# Patient Record
Sex: Male | Born: 2000 | Race: Black or African American | Hispanic: No | Marital: Single | Smoking: Never smoker
Health system: Southern US, Community
[De-identification: ages and names within clinical notes are randomized; demographics above are authoritative.]

## PROBLEM LIST (undated history)

## (undated) HISTORY — PX: OTHER SURGICAL HISTORY: SHX169

---
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
## 2020-02-29 ENCOUNTER — Encounter (HOSPITAL_BASED_OUTPATIENT_CLINIC_OR_DEPARTMENT_OTHER): Payer: Self-pay | Admitting: *Deleted

## 2020-02-29 ENCOUNTER — Emergency Department (HOSPITAL_BASED_OUTPATIENT_CLINIC_OR_DEPARTMENT_OTHER)
Admission: EM | Admit: 2020-02-29 | Discharge: 2020-02-29 | Disposition: A | Discharge status: Home / Self Care | Payer: BC Managed Care – PPO | Attending: Emergency Medicine | Admitting: Emergency Medicine

## 2020-02-29 ENCOUNTER — Other Ambulatory Visit: Payer: Self-pay

## 2020-02-29 ENCOUNTER — Emergency Department (HOSPITAL_BASED_OUTPATIENT_CLINIC_OR_DEPARTMENT_OTHER): Payer: BC Managed Care – PPO

## 2020-02-29 DIAGNOSIS — Y9367 Activity, basketball: Secondary | ICD-10-CM | POA: Diagnosis not present

## 2020-02-29 DIAGNOSIS — S52615A Nondisplaced fracture of left ulna styloid process, initial encounter for closed fracture: Secondary | ICD-10-CM | POA: Diagnosis not present

## 2020-02-29 DIAGNOSIS — W2105XA Struck by basketball, initial encounter: Secondary | ICD-10-CM | POA: Insufficient documentation

## 2020-02-29 DIAGNOSIS — S6992XA Unspecified injury of left wrist, hand and finger(s), initial encounter: Secondary | ICD-10-CM | POA: Diagnosis present

## 2020-02-29 MED ORDER — ACETAMINOPHEN 325 MG PO TABS: 650.0000 mg | ORAL_TABLET | Freq: Four times a day (QID) | ORAL | 0 refills | Status: DC | PRN

## 2020-02-29 MED ORDER — IBUPROFEN 600 MG PO TABS: 600.0000 mg | ORAL_TABLET | Freq: Four times a day (QID) | ORAL | 0 refills | Status: DC | PRN

## 2020-02-29 NOTE — ED Provider Notes (Signed)
[facility] EMERGENCY DEPARTMENT Provider Note   CSN: [id number] Arrival date & time: 02/29/20  1829     History Chief Complaint  Patient presents with   Wrist Injury    Terry Salinas is a 19 y.o. male presenting to the ED with left wrist pain.  Patient was playing basketball and jumping for a shot, then had his legs knocked out under him.  He fell on his outstretched left hand, and now has pain in his left wrist.  No other acute injuries reported.  He hasn't taken any meds.  Pain is 10/10, localized to the wrist, does not travel, is worse with arm movement, better at rest. He's never had this pain before.  It is throbbing in quality.  NKDA  HPI     History reviewed. No pertinent past medical history.  Patient Active Problem List   Diagnosis Date Noted   Right knee injury, subsequent encounter 01/24/2017   Fracture of phalanx of left middle finger 08/13/2015    No past surgical history on file.     No family history on file.  Social History   Tobacco Use   Smoking status: Never Smoker   Smokeless tobacco: Never Used  Vaping Use   Vaping Use: Never used  Substance Use Topics   Alcohol use: No    Alcohol/week: 0.0 standard drinks   Drug use: No    Home Medications Prior to Admission medications   Medication Sig Start Date End Date Taking? Authorizing Provider  acetaminophen (TYLENOL) 325 MG tablet Take 2 tablets (650 mg total) by mouth every 6 (six) hours as needed for up to 30 doses for mild pain. 02/29/20   Terald Sleeper, MD  ibuprofen (ADVIL) 600 MG tablet Take 1 tablet (600 mg total) by mouth every 6 (six) hours as needed for up to 30 doses for mild pain or moderate pain. 02/29/20   Terald Sleeper, MD  ibuprofen (ADVIL,MOTRIN) 600 MG tablet Take 1 tablet (600 mg total) by mouth every 6 (six) hours as needed. 04/14/18   Derwood Kaplan, MD    Allergies    Patient has no known allergies.  Review of Systems   Review of Systems    Constitutional: Negative for chills and fever.  Eyes: Negative for photophobia and visual disturbance.  Respiratory: Negative for cough and shortness of breath.   Cardiovascular: Negative for chest pain and palpitations.  Gastrointestinal: Negative for abdominal pain and vomiting.  Musculoskeletal: Positive for arthralgias and myalgias.  Skin: Negative for rash and wound.  Neurological: Negative for weakness and numbness.  Psychiatric/Behavioral: Negative for agitation and confusion.  All other systems reviewed and are negative.   Physical Exam Updated Vital Signs BP (!) 132/91 (BP Location: Right Arm)    Pulse 67    Temp 98.7 F (37.1 C) (Oral)    Resp 20    Ht 6' (1.829 m)    Wt 102.1 kg    SpO2 100%    BMI 30.52 kg/m   Physical Exam Vitals and nursing note reviewed.  Constitutional:      Appearance: He is well-developed.  HENT:     Head: Normocephalic and atraumatic.  Eyes:     Conjunctiva/sclera: Conjunctivae normal.  Cardiovascular:     Rate and Rhythm: Normal rate and regular rhythm.     Pulses: Normal pulses.  Pulmonary:     Effort: Pulmonary effort is normal. No respiratory distress.     Breath sounds: Normal breath sounds.  Musculoskeletal:  Cervical back: Neck supple.     Comments: Tenderness of the distal radius and ulna, no gross deformity, visible swelling of the ulnar aspect of the wrist. No ttp of the palm or fingers or proximal forearm No ttp of the elbow or shoulder Remainder of MSK exam normal  Skin:    General: Skin is warm and dry.  Neurological:     General: No focal deficit present.     Mental Status: He is alert and oriented to person, place, and time.     Sensory: No sensory deficit.     Motor: No weakness.     Comments: Intact opposition, finger abduction.  Sensation preserved in all hand dermatomes of left hand.  No wrist drop.  Wrist flexion/extension limited by pain.   Psychiatric:        Mood and Affect: Mood normal.        Behavior:  Behavior normal.     ED Results / Procedures / Treatments   Labs (all labs ordered are listed, but only abnormal results are displayed) Labs Reviewed - No data to display  EKG None  Radiology DG Wrist Complete Left  Result Date: 02/29/2020 CLINICAL DATA:  19 year old male status post basketball injury. EXAM: LEFT WRIST - COMPLETE 3+ VIEW COMPARISON:  Left middle finger series 08/09/2015. FINDINGS: Minimally displaced ulnar styloid fracture. Skeletally immature. Distal radius appears within normal limits. Normal carpal bone alignment and joint spaces. Scaphoid intact. No other acute osseous abnormality identified. IMPRESSION: Minimally displaced ulnar styloid fracture. Electronically Signed   By: Odessa Fleming M.D.   On: 02/29/2020 19:45    Procedures Procedures (including critical care time)  Medications Ordered in ED Medications - No data to display  ED Course  I have reviewed the triage vital signs and the nursing notes.  Pertinent labs & imaging results that were available during my care of the patient were reviewed by me and considered in my medical decision making (see chart for details).  19 yo male presenting to ED with ulnar styloid fracture after FOOSH injury playing basketball.  This fx is minimally displaced on xray.  He is neurovascularly intact on exam.  Plan for an ulnar gutter splint, f/u with hand surgery.  Motrin and tylenol for pain at home.  Advised not to use his left hand until cleared to do so by his surgeon.  Work note provided.  Clinical Course as of Mar 01 1113  Wynelle Link Feb 29, 2020  2045 Placed ulnar gutter splint, d/c with hand surgery f/u   [MT]    Clinical Course User Index [MT] Terald Sleeper, MD    Final Clinical Impression(s) / ED Diagnoses Final diagnoses:  Closed nondisplaced fracture of styloid process of left ulna, initial encounter    Rx / DC Orders ED Discharge Orders         Ordered    ibuprofen (ADVIL) 600 MG tablet  Every 6 hours PRN         02/29/20 2047    acetaminophen (TYLENOL) 325 MG tablet  Every 6 hours PRN        02/29/20 2047           Terald Sleeper, MD 03/01/20 1114

## 2020-02-29 NOTE — ED Triage Notes (Signed)
States he was playing basketball and feet got taken out from under him and landed on left wrist. +deformity. CMS intact

## 2020-09-01 ENCOUNTER — Other Ambulatory Visit: Payer: Self-pay

## 2020-09-01 ENCOUNTER — Emergency Department (HOSPITAL_BASED_OUTPATIENT_CLINIC_OR_DEPARTMENT_OTHER)
Admission: EM | Admit: 2020-09-01 | Discharge: 2020-09-01 | Disposition: A | Discharge status: Home / Self Care | Payer: BC Managed Care – PPO | Attending: Emergency Medicine | Admitting: Emergency Medicine

## 2020-09-01 ENCOUNTER — Emergency Department (HOSPITAL_BASED_OUTPATIENT_CLINIC_OR_DEPARTMENT_OTHER): Payer: BC Managed Care – PPO

## 2020-09-01 ENCOUNTER — Encounter (HOSPITAL_BASED_OUTPATIENT_CLINIC_OR_DEPARTMENT_OTHER): Payer: Self-pay

## 2020-09-01 DIAGNOSIS — S92355A Nondisplaced fracture of fifth metatarsal bone, left foot, initial encounter for closed fracture: Secondary | ICD-10-CM | POA: Diagnosis not present

## 2020-09-01 DIAGNOSIS — Y9367 Activity, basketball: Secondary | ICD-10-CM | POA: Diagnosis not present

## 2020-09-01 DIAGNOSIS — S99922A Unspecified injury of left foot, initial encounter: Secondary | ICD-10-CM | POA: Diagnosis present

## 2020-09-01 DIAGNOSIS — X58XXXA Exposure to other specified factors, initial encounter: Secondary | ICD-10-CM | POA: Insufficient documentation

## 2020-09-01 NOTE — ED Triage Notes (Signed)
Pt reports injury to left foot playing basketball ~945p-NAD-limping gait

## 2020-09-01 NOTE — ED Provider Notes (Addendum)
[facility] EMERGENCY DEPARTMENT Provider Note   CSN: [id number] Arrival date & time: 09/01/20  2213     History Chief Complaint  Patient presents with  . Foot Injury    Terry Salinas is a 20 y.o. male.  20 year old male presents with complaint of left lateral foot pain.  Patient states that he was playing basketball earlier today, came down on his right foot and when he went to pivot to the left he felt a pop on the lateral aspect of his left foot.  Patient states he has broken his right foot in the past and feels like his left foot gives out frequently but never to this extent.  Reports significant difficulty bearing weight on the foot after the injury.  No other complaints or concerns today.        History reviewed. No pertinent past medical history.  Patient Active Problem List   Diagnosis Date Noted  . Right knee injury, subsequent encounter 01/24/2017  . Fracture of phalanx of left middle finger 08/13/2015    History reviewed. No pertinent surgical history.     No family history on file.  Social History   Tobacco Use  . Smoking status: Never Smoker  . Smokeless tobacco: Never Used  Vaping Use  . Vaping Use: Never used  Substance Use Topics  . Alcohol use: No  . Drug use: No    Home Medications Prior to Admission medications   Medication Sig Start Date End Date Taking? Authorizing Provider  acetaminophen (TYLENOL) 325 MG tablet Take 2 tablets (650 mg total) by mouth every 6 (six) hours as needed for up to 30 doses for mild pain. 02/29/20   Terald Sleeper, MD  ibuprofen (ADVIL) 600 MG tablet Take 1 tablet (600 mg total) by mouth every 6 (six) hours as needed for up to 30 doses for mild pain or moderate pain. 02/29/20   Terald Sleeper, MD  ibuprofen (ADVIL,MOTRIN) 600 MG tablet Take 1 tablet (600 mg total) by mouth every 6 (six) hours as needed. 04/14/18   Derwood Kaplan, MD    Allergies    Fish allergy  Review of Systems   Review of  Systems  Constitutional: Negative for fever.  Musculoskeletal: Positive for arthralgias, gait problem and myalgias.  Skin: Negative for color change, rash and wound.  Allergic/Immunologic: Negative for immunocompromised state.  Neurological: Negative for weakness and numbness.    Physical Exam Updated Vital Signs BP (!) 124/91   Pulse 60   Temp 97.6 F (36.4 C) (Oral)   Resp 16   Ht 6' (1.829 m)   Wt 94.3 kg   SpO2 100%   BMI 28.21 kg/m   Physical Exam Vitals and nursing note reviewed.  Constitutional:      General: He is not in acute distress.    Appearance: He is well-developed. He is not diaphoretic.  HENT:     Head: Normocephalic and atraumatic.  Cardiovascular:     Pulses: Normal pulses.  Pulmonary:     Effort: Pulmonary effort is normal.  Musculoskeletal:        General: Swelling and tenderness present. No deformity.       Legs:     Comments: Tenderness with palpation of the lateral left foot, no specific tenderness at the proximal fifth metatarsal.  DP pulse present, sensation intact.  Skin:    General: Skin is warm and dry.     Findings: No bruising, erythema or rash.  Neurological:  Mental Status: He is alert and oriented to person, place, and time.     Sensory: No sensory deficit.     Motor: No weakness.  Psychiatric:        Behavior: Behavior normal.     ED Results / Procedures / Treatments   Labs (all labs ordered are listed, but only abnormal results are displayed) Labs Reviewed - No data to display  EKG None  Radiology DG Foot Complete Left  Result Date: 09/01/2020 CLINICAL DATA:  Basketball injury with lateral foot pain, initial encounter EXAM: LEFT FOOT - COMPLETE 3+ VIEW COMPARISON:  None. FINDINGS: There is a fracture noted in the proximal fifth metatarsal without significant displacement. Mild soft tissue swelling is noted. No other fractures are seen. IMPRESSION: Proximal fifth metatarsal fracture involving the diaphyseal metaphyseal  junction. Electronically Signed   By: Alcide Clever M.D.   On: 09/01/2020 22:52    Procedures Procedures   Medications Ordered in ED Medications - No data to display  ED Course  I have reviewed the triage vital signs and the nursing notes.  Pertinent labs & imaging results that were available during my care of the patient were reviewed by me and considered in my medical decision making (see chart for details).  Clinical Course as of 09/01/20 2310  Wed Sep 01, 2020  [id number] 20 year old male with lateral left foot pain, felt pop playing basketball today. On exam, swelling to lateral foot with general lateral tenderness without point tenderness.  [LM]  2310 XR with 5th metatarsal fracture. Patient was placed in a  posterior splint and given crutches, advised to be nonweightbearing and follow-up with sports medicine. [LM]    Clinical Course User Index [LM] Alden Hipp   MDM Rules/Calculators/A&P                         SPLINT APPLICATION Date/Time: 11:33 PM Authorized by: Jeannie Fend Consent: Verbal consent obtained. Risks and benefits: risks, benefits and alternatives were discussed Consent given by: patient Splint applied by: orthopedic technician Location details: left leg Splint type: posterior OCL Supplies used: webril, ACE, stockinet, OCL Post-procedure: The splinted body part was neurovascularly unchanged following the procedure. Patient tolerance: Patient tolerated the procedure well with no immediate complications.   Final Clinical Impression(s) / ED Diagnoses Final diagnoses:  Closed nondisplaced fracture of fifth metatarsal bone of left foot, initial encounter    Rx / DC Orders ED Discharge Orders    None       Jeannie Fend, PA-C 09/01/20 2310    Jeannie Fend, PA-C 09/01/20 2333    Tilden Fossa, MD 09/02/20 2119

## 2020-09-01 NOTE — Discharge Instructions (Signed)
Nonweight bearing. Follow up with sports medicine, referral given. Take Motrin and Tylenol as needed as directed for pain. You can apply ice to the splint and elevate for 20 minutes at a time to help with pain/swelling.

## 2020-09-06 ENCOUNTER — Other Ambulatory Visit: Payer: Self-pay

## 2020-09-06 ENCOUNTER — Encounter: Payer: Self-pay | Admitting: Family Medicine

## 2020-09-06 ENCOUNTER — Ambulatory Visit: Payer: BC Managed Care – PPO | Admitting: Family Medicine

## 2020-09-06 VITALS — BP 126/90 | Ht 72.0 in | Wt 208.0 lb

## 2020-09-06 DIAGNOSIS — S92355D Nondisplaced fracture of fifth metatarsal bone, left foot, subsequent encounter for fracture with routine healing: Secondary | ICD-10-CM | POA: Insufficient documentation

## 2020-09-06 DIAGNOSIS — S92355A Nondisplaced fracture of fifth metatarsal bone, left foot, initial encounter for closed fracture: Secondary | ICD-10-CM

## 2020-09-06 NOTE — Assessment & Plan Note (Addendum)
Injury occurred on 4/20.  It appears more in the shaft of the fifth metatarsal of as opposed to a Jones fracture. -Counseled on home exercise therapy and supportive care. -Cam walker. -Provided work note. -Follow-up in 2 weeks and reimage.

## 2020-09-06 NOTE — Progress Notes (Addendum)
  Terry Salinas - 20 y.o. male MRN [id number]  Date of birth: 12-31-2000  SUBJECTIVE:  Including CC & ROS.  No chief complaint on file.   Terry Salinas is a 20 y.o. male that is presenting with left foot pain.  He was playing basketball felt a pop in his left foot.  X-ray of the foot was demonstrating a fracture.  He was placed in a splint.  Still having ongoing swelling.  Independent review of the left foot x-ray from 4/20 shows a nondisplaced shaft fracture   Review of Systems See HPI   HISTORY: Past Medical, Surgical, Social, and Family History Reviewed & Updated per EMR.   Pertinent Historical Findings include:  No past medical history on file.  No past surgical history on file.  No family history on file.  Social History   Socioeconomic History  . Marital status: Single    Spouse name: Not on file  . Number of children: Not on file  . Years of education: Not on file  . Highest education level: Not on file  Occupational History  . Not on file  Tobacco Use  . Smoking status: Never Smoker  . Smokeless tobacco: Never Used  Vaping Use  . Vaping Use: Never used  Substance and Sexual Activity  . Alcohol use: No  . Drug use: No  . Sexual activity: Not on file  Other Topics Concern  . Not on file  Social History Narrative  . Not on file   Social Determinants of Health   Financial Resource Strain: Not on file  Food Insecurity: Not on file  Transportation Needs: Not on file  Physical Activity: Not on file  Stress: Not on file  Social Connections: Not on file  Intimate Partner Violence: Not on file     PHYSICAL EXAM:  VS: BP 126/90 (BP Location: Left Arm, Patient Position: Sitting, Cuff Size: Large)   Ht 6' (1.829 m)   Wt 208 lb (94.3 kg)   BMI 28.21 kg/m  Physical Exam Gen: NAD, alert, cooperative with exam, well-appearing MSK:  Left foot and ankle: Swelling of the dorsum of the foot. Tenderness to palpation of the fifth metatarsal. Neurovascular  intact     ASSESSMENT & PLAN:   Nondisplaced fracture of the fifth metatarsal bone, left foot, initial encounter for closed fracture Injury occurred on 4/20.  It appears more in the shaft of the fifth metatarsal as opposed to a Jones fracture. -Counseled on home exercise therapy and supportive care. -Cam walker. -Provided work note. -Follow-up in 2 weeks and reimage.

## 2020-09-06 NOTE — Patient Instructions (Signed)
Nice to meet you Please use ice  Please elevate the foot  Please use the boot  Please send me a message in MyChart with any questions or updates.  Please see me back in 2 weeks.   --Dr. Jordan Likes

## 2020-09-20 ENCOUNTER — Ambulatory Visit (INDEPENDENT_AMBULATORY_CARE_PROVIDER_SITE_OTHER): Payer: BC Managed Care – PPO | Admitting: Family Medicine

## 2020-09-20 ENCOUNTER — Encounter: Payer: Self-pay | Admitting: Family Medicine

## 2020-09-20 ENCOUNTER — Other Ambulatory Visit: Payer: Self-pay

## 2020-09-20 ENCOUNTER — Ambulatory Visit (HOSPITAL_BASED_OUTPATIENT_CLINIC_OR_DEPARTMENT_OTHER)
Admission: RE | Admit: 2020-09-20 | Discharge: 2020-09-20 | Disposition: A | Discharge status: Home / Self Care | Payer: BC Managed Care – PPO | Source: Ambulatory Visit | Attending: Family Medicine | Admitting: Family Medicine

## 2020-09-20 DIAGNOSIS — S92355D Nondisplaced fracture of fifth metatarsal bone, left foot, subsequent encounter for fracture with routine healing: Secondary | ICD-10-CM | POA: Diagnosis not present

## 2020-09-20 NOTE — Assessment & Plan Note (Addendum)
Initial injury 4/20.  Is getting improvement. -Counseled on home exercise therapy and supportive care. -Continue cam walker. -X-ray. -Follow-up in 2 weeks.

## 2020-09-20 NOTE — Patient Instructions (Signed)
Good to see you I will call with the results from today  You can try walking without the boot around your house if your pain allows  Please ice as needed  Please send me a message in MyChart with any questions or updates.  Please see me back in 2 weeks.   --Dr. Jordan Likes

## 2020-09-20 NOTE — Progress Notes (Signed)
  Terry Salinas - 20 y.o. male MRN [id number]  Date of birth: 2000/05/27  SUBJECTIVE:  Including CC & ROS.  No chief complaint on file.   Terry Salinas is a 20 y.o. male that is following up for his left foot fracture.  He has been doing well and pain has been improving.   Review of Systems See HPI   HISTORY: Past Medical, Surgical, Social, and Family History Reviewed & Updated per EMR.   Pertinent Historical Findings include:  History reviewed. No pertinent past medical history.  History reviewed. No pertinent surgical history.  History reviewed. No pertinent family history.  Social History   Socioeconomic History  . Marital status: Single    Spouse name: Not on file  . Number of children: Not on file  . Years of education: Not on file  . Highest education level: Not on file  Occupational History  . Not on file  Tobacco Use  . Smoking status: Never Smoker  . Smokeless tobacco: Never Used  Vaping Use  . Vaping Use: Never used  Substance and Sexual Activity  . Alcohol use: No  . Drug use: No  . Sexual activity: Not on file  Other Topics Concern  . Not on file  Social History Narrative  . Not on file   Social Determinants of Health   Financial Resource Strain: Not on file  Food Insecurity: Not on file  Transportation Needs: Not on file  Physical Activity: Not on file  Stress: Not on file  Social Connections: Not on file  Intimate Partner Violence: Not on file     PHYSICAL EXAM:  VS: BP 128/84 (BP Location: Left Arm, Patient Position: Sitting, Cuff Size: Large)   Ht 6' (1.829 m)   Wt 218 lb (98.9 kg)   BMI 29.57 kg/m  Physical Exam Gen: NAD, alert, cooperative with exam, well-appearing MSK:  Left foot: Some swelling over the shaft of the fifth metatarsal. No redness or overlying skin changes. Tenderness to palpation over the fifth metatarsal shaft. Neurovascular intact     ASSESSMENT & PLAN:   Nondisplaced fracture of fifth metatarsal bone,  left foot, subsequent encounter for fracture with routine healing Initial injury 4/20.  Is getting improvement. -Counseled on home exercise therapy and supportive care. -Continue cam walker. -X-ray. -Follow-up in 2 weeks.

## 2020-09-24 ENCOUNTER — Telehealth: Payer: Self-pay | Admitting: Family Medicine

## 2020-09-24 NOTE — Telephone Encounter (Signed)
Informed of results.   Myra Rude, MD [facility] 09/24/2020, 8:17 AM

## 2020-10-04 ENCOUNTER — Ambulatory Visit: Payer: BC Managed Care – PPO | Admitting: Family Medicine

## 2020-10-06 ENCOUNTER — Ambulatory Visit: Payer: BC Managed Care – PPO | Admitting: Family Medicine

## 2020-10-06 ENCOUNTER — Other Ambulatory Visit: Payer: Self-pay

## 2020-10-06 ENCOUNTER — Ambulatory Visit (HOSPITAL_BASED_OUTPATIENT_CLINIC_OR_DEPARTMENT_OTHER)
Admission: RE | Admit: 2020-10-06 | Discharge: 2020-10-06 | Disposition: A | Discharge status: Home / Self Care | Payer: BC Managed Care – PPO | Source: Ambulatory Visit | Attending: Family Medicine | Admitting: Family Medicine

## 2020-10-06 DIAGNOSIS — S92355D Nondisplaced fracture of fifth metatarsal bone, left foot, subsequent encounter for fracture with routine healing: Secondary | ICD-10-CM

## 2020-10-06 NOTE — Progress Notes (Signed)
  Terry Salinas - 20 y.o. male MRN [id number]  Date of birth: 28-Dec-2000  SUBJECTIVE:  Including CC & ROS.  No chief complaint on file.   Terry Salinas is a 20 y.o. male that is following up for his left foot fracture.  He has been doing well with limited to no pain.   Review of Systems See HPI   HISTORY: Past Medical, Surgical, Social, and Family History Reviewed & Updated per EMR.   Pertinent Historical Findings include:  No past medical history on file.  No past surgical history on file.  No family history on file.  Social History   Socioeconomic History  . Marital status: Single    Spouse name: Not on file  . Number of children: Not on file  . Years of education: Not on file  . Highest education level: Not on file  Occupational History  . Not on file  Tobacco Use  . Smoking status: Never Smoker  . Smokeless tobacco: Never Used  Vaping Use  . Vaping Use: Never used  Substance and Sexual Activity  . Alcohol use: No  . Drug use: No  . Sexual activity: Not on file  Other Topics Concern  . Not on file  Social History Narrative  . Not on file   Social Determinants of Health   Financial Resource Strain: Not on file  Food Insecurity: Not on file  Transportation Needs: Not on file  Physical Activity: Not on file  Stress: Not on file  Social Connections: Not on file  Intimate Partner Violence: Not on file     PHYSICAL EXAM:  VS: BP 120/76   Ht 6' (1.829 m)   Wt 214 lb (97.1 kg)   BMI 29.02 kg/m  Physical Exam Gen: NAD, alert, cooperative with exam, well-appearing MSK:  Left foot: Mild swelling over the lateral midfoot. Tenderness to palpation at the base of the fifth metatarsal. Neurovascular intact     ASSESSMENT & PLAN:   Nondisplaced fracture of fifth metatarsal bone, left foot, subsequent encounter for fracture with routine healing Injury occurred on 4/20.  Pain is intermittent and mild. -Counseled on home exercise therapy and supportive  care. -X-ray. -Follow-up in 4 weeks.

## 2020-10-06 NOTE — Assessment & Plan Note (Addendum)
Injury occurred on 4/20.  Pain is intermittent and mild. -Counseled on home exercise therapy and supportive care. -X-ray. -Follow-up in 4 weeks.

## 2020-10-06 NOTE — Patient Instructions (Signed)
Good to see you Please use ice as needed   I will call with the results from today  Please send me a message in MyChart with any questions or updates.  Please see me back in 4 weeks.   --Dr. Branden Shallenberger  

## 2020-10-08 ENCOUNTER — Telehealth: Payer: Self-pay | Admitting: Family Medicine

## 2020-10-08 NOTE — Telephone Encounter (Signed)
Informed of results.   Myra Rude, MD [facility] 10/08/2020, 8:36 AM

## 2020-11-08 ENCOUNTER — Encounter: Payer: Self-pay | Admitting: Family Medicine

## 2020-11-08 ENCOUNTER — Ambulatory Visit (INDEPENDENT_AMBULATORY_CARE_PROVIDER_SITE_OTHER): Payer: BC Managed Care – PPO | Admitting: Family Medicine

## 2020-11-08 ENCOUNTER — Other Ambulatory Visit: Payer: Self-pay

## 2020-11-08 ENCOUNTER — Ambulatory Visit (HOSPITAL_BASED_OUTPATIENT_CLINIC_OR_DEPARTMENT_OTHER)
Admission: RE | Admit: 2020-11-08 | Discharge: 2020-11-08 | Disposition: A | Discharge status: Home / Self Care | Payer: BC Managed Care – PPO | Source: Ambulatory Visit | Attending: Family Medicine | Admitting: Family Medicine

## 2020-11-08 VITALS — BP 124/78 | Ht 72.0 in | Wt 214.0 lb

## 2020-11-08 DIAGNOSIS — S92355D Nondisplaced fracture of fifth metatarsal bone, left foot, subsequent encounter for fracture with routine healing: Secondary | ICD-10-CM

## 2020-11-08 DIAGNOSIS — M7651 Patellar tendinitis, right knee: Secondary | ICD-10-CM

## 2020-11-08 NOTE — Progress Notes (Signed)
  Terry Salinas - 20 y.o. male MRN [id number]  Date of birth: 07-Oct-2000  SUBJECTIVE:  Including CC & ROS.  No chief complaint on file.   Terry Salinas is a 20 y.o. male that is following up for his left foot fracture and presenting with right knee pain.  His foot fracture has been doing well with little to no pain and swelling.  Presenting with right anterior knee pain.  Pain is localized over the patellar tendon.  No injury or inciting event.   Review of Systems See HPI   HISTORY: Past Medical, Surgical, Social, and Family History Reviewed & Updated per EMR.   Pertinent Historical Findings include:  History reviewed. No pertinent past medical history.  History reviewed. No pertinent surgical history.  History reviewed. No pertinent family history.  Social History   Socioeconomic History   Marital status: Single    Spouse name: Not on file   Number of children: Not on file   Years of education: Not on file   Highest education level: Not on file  Occupational History   Not on file  Tobacco Use   Smoking status: Never   Smokeless tobacco: Never  Vaping Use   Vaping Use: Never used  Substance and Sexual Activity   Alcohol use: No   Drug use: No   Sexual activity: Not on file  Other Topics Concern   Not on file  Social History Narrative   Not on file   Social Determinants of Health   Financial Resource Strain: Not on file  Food Insecurity: Not on file  Transportation Needs: Not on file  Physical Activity: Not on file  Stress: Not on file  Social Connections: Not on file  Intimate Partner Violence: Not on file     PHYSICAL EXAM:  VS: BP 124/78 (BP Location: Left Arm, Patient Position: Sitting, Cuff Size: Large)   Ht 6' (1.829 m)   Wt 214 lb (97.1 kg)   BMI 29.02 kg/m  Physical Exam Gen: NAD, alert, cooperative with exam, well-appearing MSK:  Right knee: Tenderness palpation at the insertion of the patellar tendon. Normal strength resistance. No  effusion. Neurovascular intact     ASSESSMENT & PLAN:   Nondisplaced fracture of fifth metatarsal bone, left foot, subsequent encounter for fracture with routine healing Initial injury on 4/20.  No swelling or pain on exam today. -Counseled on home exercise therapy and supportive care. -X-ray. -Counseled on return to activity.  Patellar tendinitis of right knee Symptoms most consistent with patellar tendinitis.  No inciting event or trauma. -Counseled on home exercise therapy and supportive care. -Patellar strap. -Could consider physical therapy or shockwave.

## 2020-11-08 NOTE — Assessment & Plan Note (Signed)
Initial injury on 4/20.  No swelling or pain on exam today. -Counseled on home exercise therapy and supportive care. -X-ray. -Counseled on return to activity.

## 2020-11-08 NOTE — Patient Instructions (Signed)
Good to see you Please try the exercises  Please try the strap  I will call with the results from today   Please send me a message in MyChart with any questions or updates.  Please see [place] back as needed.   --Dr. Jordan Likes

## 2020-11-08 NOTE — Assessment & Plan Note (Signed)
Symptoms most consistent with patellar tendinitis.  No inciting event or trauma. -Counseled on home exercise therapy and supportive care. -Patellar strap. -Could consider physical therapy or shockwave.

## 2020-11-10 ENCOUNTER — Telehealth: Payer: Self-pay | Admitting: Family Medicine

## 2020-11-10 NOTE — Telephone Encounter (Signed)
Informed of results.   Myra Rude, MD [facility] 11/10/2020, 2:00 PM

## 2020-11-16 ENCOUNTER — Telehealth: Payer: Self-pay | Admitting: Family Medicine

## 2020-11-16 NOTE — Telephone Encounter (Signed)
Patient lft msg 7/5 requesting to be released to return to work with No Restrictions.  --Forwarding request to provider.  --glh

## 2020-11-17 NOTE — Telephone Encounter (Signed)
Provided work note.   Myra Rude, MD [facility] 11/17/2020, 4:50 PM

## 2020-11-17 NOTE — Telephone Encounter (Signed)
Pt  called back w/ Employer info to fax RTW letter to :   Ander Slade @ [phone]  --glh

## 2020-11-23 ENCOUNTER — Telehealth: Payer: Self-pay | Admitting: Family Medicine

## 2020-11-23 NOTE — Telephone Encounter (Signed)
Patient called back request RTW extended to 7/14/2 --forwarding request to med asst to review w/ provider for RTW date changes .  -glh

## 2020-11-25 NOTE — Telephone Encounter (Signed)
Left VM for patient. If he calls back please have him speak with a nurse/CMA and ask for clarification on his letter. Did he go back to work? What specifically does it need to say?.   If any questions then please take the best time and phone number to call and I will try to call him back.   Myra Rude, MD [facility] 11/25/2020, 9:14 AM

## 2020-12-02 NOTE — Telephone Encounter (Signed)
Left message for patient to call back to see if he still needs this letter.

## 2020-12-27 ENCOUNTER — Emergency Department (HOSPITAL_BASED_OUTPATIENT_CLINIC_OR_DEPARTMENT_OTHER)
Admission: EM | Admit: 2020-12-27 | Discharge: 2020-12-27 | Disposition: A | Discharge status: Home / Self Care | Payer: BC Managed Care – PPO | Attending: Emergency Medicine | Admitting: Emergency Medicine

## 2020-12-27 ENCOUNTER — Encounter (HOSPITAL_BASED_OUTPATIENT_CLINIC_OR_DEPARTMENT_OTHER): Payer: Self-pay

## 2020-12-27 ENCOUNTER — Other Ambulatory Visit: Payer: Self-pay

## 2020-12-27 ENCOUNTER — Emergency Department (HOSPITAL_BASED_OUTPATIENT_CLINIC_OR_DEPARTMENT_OTHER): Payer: BC Managed Care – PPO

## 2020-12-27 DIAGNOSIS — R110 Nausea: Secondary | ICD-10-CM | POA: Diagnosis not present

## 2020-12-27 DIAGNOSIS — R001 Bradycardia, unspecified: Secondary | ICD-10-CM | POA: Diagnosis not present

## 2020-12-27 DIAGNOSIS — R42 Dizziness and giddiness: Secondary | ICD-10-CM | POA: Diagnosis not present

## 2020-12-27 LAB — BASIC METABOLIC PANEL
Anion gap: 8 (ref 5–15)
BUN: 20 mg/dL (ref 6–20)
CO2: 25 mmol/L (ref 22–32)
Calcium: 9.4 mg/dL (ref 8.9–10.3)
Chloride: 104 mmol/L (ref 98–111)
Creatinine, Ser: 1.23 mg/dL (ref 0.61–1.24)
GFR, Estimated: >60 mL/min (ref >60)
Glucose, Bld: 105 mg/dL — ABNORMAL HIGH (ref 70–99)
Potassium: 4 mmol/L (ref 3.5–5.1)
Sodium: 137 mmol/L (ref 135–145)

## 2020-12-27 LAB — CBC
HCT: 43.4 % (ref 39.0–52.0)
Hemoglobin: 14.7 g/dL (ref 13.0–17.0)
MCH: 29.5 pg (ref 26.0–34.0)
MCHC: 33.9 g/dL (ref 30.0–36.0)
MCV: 87 fL (ref 80.0–100.0)
Platelets: 213 10*3/uL (ref 150–400)
RBC: 4.99 MIL/uL (ref 4.22–5.81)
RDW: 12.6 % (ref 11.5–15.5)
WBC: 4.2 10*3/uL (ref 4.0–10.5)
nRBC: 0 % (ref 0.0–0.2)

## 2020-12-27 LAB — TROPONIN I (HIGH SENSITIVITY): Troponin I (High Sensitivity): 2 ng/L (ref <18)

## 2020-12-27 MED ORDER — SODIUM CHLORIDE 0.9 % IV BOLUS
1000.0000 mL | Freq: Once | INTRAVENOUS | Status: AC
  Administered 2020-12-27: 1000 mL via INTRAVENOUS

## 2020-12-27 NOTE — Discharge Instructions (Addendum)
You were seen in the emergency department today for dizziness.  Your blood work was overall reassuring.  Your chest x-ray was normal.  Your heart monitoring did show that you are mostly in sinus bradycardia which is a somewhat slower than normal heart rate.  At times you have episodes that are consistent with second-degree heart block, type I.  This typically does not require treatment/intervention.   We would like you to rest and drink plenty of fluids especially over the next few days.  We would also like you to follow-up with cardiology, please see their office phone number in your discharge instructions.  Return to the emergency department for new or worsening symptoms including but not limited to new or worsening dizziness, passing out, chest pain, trouble breathing, abnormal heartbeat sensation, or any other concerns.

## 2020-12-27 NOTE — ED Provider Notes (Signed)
[facility] EMERGENCY DEPARTMENT Provider Note   CSN: [id number] Arrival date & time: 12/27/20  1038     History Chief Complaint  Patient presents with   Dizziness    Terry Salinas is a 20 y.o. male who presents to the ED with complaints of dizziness that began this AM. Patient describes the dizziness as lightheadedness as if he may pass out and feeling foggy. Fairly constant, worse with certain position changes, associated nausea with this.  Went to the nurse at work who gave him meclizine which helped with his nausea but did not change his dizziness/lightheadedness much, given his persistence in symptoms he came to the emergency department for evaluation.  He denies history of similar.  States he did not eat breakfast this morning but this is not atypical for him.  He was moving yesterday and did not eat or drink much throughout the day.  Denies chest pain, shortness of breath, syncope, numbness, weakness, visual disturbance, vomiting, or abdominal pain.  Denies recent medication changes.  Denies drug or alcohol use.   HPI     History reviewed. No pertinent past medical history.  Patient Active Problem List   Diagnosis Date Noted   Patellar tendinitis of right knee 11/08/2020   Nondisplaced fracture of fifth metatarsal bone, left foot, subsequent encounter for fracture with routine healing 09/06/2020   Right knee injury, subsequent encounter 01/24/2017   Fracture of phalanx of left middle finger 08/13/2015    History reviewed. No pertinent surgical history.     History reviewed. No pertinent family history.  Social History   Tobacco Use   Smoking status: Never   Smokeless tobacco: Never  Vaping Use   Vaping Use: Never used  Substance Use Topics   Alcohol use: No   Drug use: No    Home Medications Prior to Admission medications   Medication Sig Start Date End Date Taking? Authorizing Provider  acetaminophen (TYLENOL) 325 MG tablet Take 2 tablets (650 mg  total) by mouth every 6 (six) hours as needed for up to 30 doses for mild pain. 02/29/20   Terald Sleeper, MD  ibuprofen (ADVIL) 600 MG tablet Take 1 tablet (600 mg total) by mouth every 6 (six) hours as needed for up to 30 doses for mild pain or moderate pain. 02/29/20   Terald Sleeper, MD  ibuprofen (ADVIL,MOTRIN) 600 MG tablet Take 1 tablet (600 mg total) by mouth every 6 (six) hours as needed. 04/14/18   Derwood Kaplan, MD    Allergies    Fish allergy  Review of Systems   Review of Systems  Constitutional:  Negative for chills and fever.  HENT:  Negative for congestion, ear pain and sore throat.   Eyes:  Negative for visual disturbance.  Respiratory:  Negative for cough and shortness of breath.   Cardiovascular:  Negative for chest pain.  Gastrointestinal:  Positive for nausea. Negative for abdominal pain, diarrhea and vomiting.  Genitourinary:  Negative for dysuria.  Neurological:  Positive for dizziness and light-headedness. Negative for syncope.  All other systems reviewed and are negative.  Physical Exam Updated Vital Signs BP (!) 142/96 (BP Location: Left Arm)   Pulse (!) 49   Temp 98.2 F (36.8 C) (Oral)   Resp 14   Ht 6' (1.829 m)   Wt 97.5 kg   SpO2 100%   BMI 29.16 kg/m   Physical Exam Vitals and nursing note reviewed.  Constitutional:      General: He is not in  acute distress.    Appearance: He is well-developed. He is not toxic-appearing.  HENT:     Head: Normocephalic and atraumatic.     Mouth/Throat:     Comments: Clear.  Uvula is midline. Eyes:     General:        Right eye: No discharge.        Left eye: No discharge.     Extraocular Movements: Extraocular movements intact.     Conjunctiva/sclera: Conjunctivae normal.     Pupils: Pupils are equal, round, and reactive to light.     Comments: No rotational or vertical nystagmus.  Cardiovascular:     Rate and Rhythm: Regular rhythm. Bradycardia present.     Heart sounds: No murmur  heard. Pulmonary:     Effort: Pulmonary effort is normal. No respiratory distress.     Breath sounds: Normal breath sounds. No wheezing, rhonchi or rales.  Abdominal:     General: There is no distension.     Palpations: Abdomen is soft.     Tenderness: There is no abdominal tenderness. There is no guarding or rebound.  Musculoskeletal:     Cervical back: Neck supple.  Skin:    General: Skin is warm and dry.     Findings: No rash.  Neurological:     Mental Status: He is alert.     Comments: Clear speech.  CN II through XII grossly intact.  Sensation gross intact bilateral upper and lower extremities.  5 out of 5 symmetric grip strength and strength with plantar dorsiflexion bilaterally.  Normal finger-nose.  Negative pronator drift.  Patient is ambulatory.  Psychiatric:        Behavior: Behavior normal.    ED Results / Procedures / Treatments   Labs (all labs ordered are listed, but only abnormal results are displayed) Labs Reviewed  CBC  BASIC METABOLIC PANEL  TROPONIN I (HIGH SENSITIVITY)    EKG None  Radiology DG Chest 2 View  Result Date: 12/27/2020 CLINICAL DATA:  Dizziness.  Low heart rate. EXAM: CHEST - 2 VIEW COMPARISON:  None. FINDINGS: The heart size and mediastinal contours are within normal limits. Both lungs are clear. The visualized skeletal structures are unremarkable. IMPRESSION: No acute cardiopulmonary abnormalities. Electronically Signed   By: Signa Kell M.D.   On: 12/27/2020 11:18    Procedures Procedures   1510: Cardiac monitoring reveals sinus bradycardia @ rate of 52bpm, as reviewed and interpreted by me. Cardiac monitoring was ordered due to lightheadedness and to monitor patient for dysrhythmia.  Alarm monitor reviewed on cardiac monitor- concerns as pictured/discussed below.     Medications Ordered in ED Medications - No data to display  ED Course  I have reviewed the triage vital signs and the nursing notes.  Pertinent labs & imaging  results that were available during my care of the patient were reviewed by me and considered in my medical decision making (see chart for details).    MDM Rules/Calculators/A&P                           Patient presents to the ED with complaints of dizziness/lightheadedness without syncope.  Patient is nontoxic, bradycardic, BP mildly elevated.  Exam is otherwise benign.  EKG on arrival with sinus bradycardia.  Placed on cardiac monitor.   Additional history obtained:  Additional history obtained from chart review & nursing note review.   Lab Tests:  Labs ordered in triage- reviewed and interpreted by me,  which included:  CBC: No anemia BMP: no significant electrolyte derangement Troponin: WNL  Imaging Studies ordered:  CXR ordered by triage, I independently reviewed, formal radiology impression shows:  No acute cardiopulmonary abnormalities.   ED Course:  Labs overall unremarkable.  Given patient without chest pain, otherwise low risk, do not feel that delta troponin is necessary at this time, initial is within normal meds and EKG is without ischemia.  Feeling improved following fluids in the emergency department.  Orthostatic vitals without orthostatic hypotension, heart rate does increase with standing but not greater than 30 bpm.  Cardiac monitor reviewed, patient does have areas concerning for 2nd degree heart block, however these are sporadic, episodes pictured above= will discuss with cardiology.  15:02: CONSULT: Discussed patient presentation/evaluation with cardiologist Dr. Cristal Deer- has reviewed rhythm strips- consistent w/ Wenkebach- no need for cardiology intervention, ok to discharge home. Appreciate input.   Will discharge home at this time. I discussed results, treatment plan, need for follow-up, and return precautions with the patient and parent at bedside. Provided opportunity for questions, patient and parent confirmed understanding and are in agreement with plan.    Findings and plan of care discussed with supervising physician Dr. Criss Alvine who is in agreement.   Portions of this note were generated with Scientist, clinical (histocompatibility and immunogenetics). Dictation errors may occur despite best attempts at proofreading.  Final Clinical Impression(s) / ED Diagnoses Final diagnoses:  Dizziness    Rx / DC Orders ED Discharge Orders     None        Cherly Anderson, PA-C 12/27/20 1529    Pricilla Loveless, MD 12/29/20 [phone]

## 2020-12-27 NOTE — ED Triage Notes (Signed)
Pt arrives from work with c/o feeling dizzy states he had his VS checked at work with a CBG of 138, HR 43, 98% Room air, BP 110/82

## 2020-12-27 NOTE — ED Notes (Signed)
Pt states he is feeling better, dizziness improving. Mother at bedside, provider notified.

## 2021-01-21 ENCOUNTER — Ambulatory Visit: Payer: BC Managed Care – PPO | Admitting: Cardiology

## 2021-02-21 DIAGNOSIS — R001 Bradycardia, unspecified: Secondary | ICD-10-CM | POA: Insufficient documentation

## 2021-02-21 NOTE — Progress Notes (Signed)
Cardiology Office Note:    Date:  02/22/2021   ID:  Terry Salinas, DOB 2000-06-19, MRN [id number]  PCP:  [facility]  Cardiologist:  Norman Herrlich, MD   Referring MD: Cherly Anderson,*  ASSESSMENT:    1. Mobitz type 1 second degree AV block   2. Sinus bradycardia   3. Dizzy    PLAN:    In order of problems listed above:  As opposed to indicative of underlying conduction system disease I think his sinus bradycardia first-degree AV block and Mobitz 1 second-degree AV block are a marker of high athleticism and cardiovascular fitness.  His symptoms are not typical of bradycardia he is having vertigo.  For further evaluation I asked him to wear a 7-day event monitor to screen his heart rhythm and at that point I do not think he needs any further evaluation of its unremarkable.  His EKG and physical exam and symptoms showed no findings of cardiomyopathy.  Next appointment as needed I will bring him back to the office if there is concern with his event monitor   Medication Adjustments/Labs and Tests Ordered: Current medicines are reviewed at length with the patient today.  Concerns regarding medicines are outlined above.  No orders of the defined types were placed in this encounter.  No orders of the defined types were placed in this encounter.     Bradycardia chief complaint  History of Present Illness:    Terry Salinas is a 20 y.o. male who is being seen today for the evaluation of dizziness at the request of Micheal Likens, Pleas Koch,*.  He was seen in the emergency room 12/27/2020 was having Mobitz 1 second-degree AV block and sinus bradycardia in the 50s.  Chest x-ray was normal  EKG independently reviewed sinus bradycardia 49 bpm otherwise normal  As part of evaluation high-sensitivity troponin which was normal BNP normal creatinine 1.23 GFR greater than 60 cc potassium 4.0 CBC with a hemoglobin of 14.7  He remains athletic lifts weights 6 days a week  and plays basketball daily He has no history of heart disease congenital rheumatic or heart murmur He is employed in Set designer at [employer]. He has had episodes the last was a month ago where he feels dissociated off balance and has vertigo. He has not a sensation of syncope or near syncope. He has had no chest pain edema shortness of breath or palpitation. The symptoms have resolved. Concern is because of sinus bradycardia and Mobitz 1 second-degree AV block History reviewed. No pertinent past medical history.  Past Surgical History:  Procedure Laterality Date   NONE      Current Medications: Current Meds  Medication Sig   Vitamin D, Ergocalciferol, (DRISDOL) 1.25 MG (50000 UNIT) CAPS capsule Take 50,000 Units by mouth once a week.     Allergies:   Fish allergy   Social History   Socioeconomic History   Marital status: Single    Spouse name: Not on file   Number of children: Not on file   Years of education: Not on file   Highest education level: Not on file  Occupational History   Not on file  Tobacco Use   Smoking status: Never   Smokeless tobacco: Never  Vaping Use   Vaping Use: Never used  Substance and Sexual Activity   Alcohol use: No   Drug use: No   Sexual activity: Not on file  Other Topics Concern   Not on file  Social History Narrative  Not on file   Social Determinants of Health   Financial Resource Strain: Not on file  Food Insecurity: Not on file  Transportation Needs: Not on file  Physical Activity: Not on file  Stress: Not on file  Social Connections: Not on file     Family History: There is a paternal uncle who played collegiate football had sleep apnea and has a pacemaker  ROS:   ROS Please see the history of present illness.     All other systems reviewed and are negative.  EKGs/Labs/Other Studies Reviewed:    The following studies were reviewed today:   EKG:  EKG is  ordered today.  The ekg ordered today is  personally reviewed and demonstrates sinus rhythm 50 bpm per square AV block otherwise normal EKG  Recent Labs: 12/27/2020: BUN 20; Creatinine, Ser 1.23; Hemoglobin 14.7; Platelets 213; Potassium 4.0; Sodium 137  Recent Lipid Panel No results found for: CHOL, TRIG, HDL, CHOLHDL, VLDL, LDLCALC, LDLDIRECT  Physical Exam:    VS:  BP 128/66 (BP Location: Right Arm, Patient Position: Sitting, Cuff Size: Large)   Pulse (!) 50   Ht 6' (1.829 m)   Wt 219 lb (99.3 kg)   SpO2 98%   BMI 29.70 kg/m     Wt Readings from Last 3 Encounters:  02/22/21 219 lb (99.3 kg)  12/27/20 215 lb (97.5 kg)  11/08/20 214 lb (97.1 kg)     GEN: Healthy looking athletic young man well nourished, well developed in no acute distress HEENT: Normal NECK: No JVD; No carotid bruits LYMPHATICS: No lymphadenopathy CARDIAC: RRR, no murmurs, rubs, gallops RESPIRATORY:  Clear to auscultation without rales, wheezing or rhonchi  ABDOMEN: Soft, non-tender, non-distended MUSCULOSKELETAL:  No edema; No deformity  SKIN: Warm and dry NEUROLOGIC:  Alert and oriented x 3 PSYCHIATRIC:  Normal affect     Signed, Norman Herrlich, MD  02/22/2021 4:09 PM    [facility]

## 2021-02-22 ENCOUNTER — Other Ambulatory Visit: Payer: Self-pay

## 2021-02-22 ENCOUNTER — Ambulatory Visit (INDEPENDENT_AMBULATORY_CARE_PROVIDER_SITE_OTHER): Payer: BC Managed Care – PPO

## 2021-02-22 ENCOUNTER — Ambulatory Visit: Payer: BC Managed Care – PPO | Admitting: Cardiology

## 2021-02-22 ENCOUNTER — Encounter: Payer: Self-pay | Admitting: Cardiology

## 2021-02-22 VITALS — BP 128/66 | HR 50 | Ht 72.0 in | Wt 219.0 lb

## 2021-02-22 DIAGNOSIS — R001 Bradycardia, unspecified: Secondary | ICD-10-CM

## 2021-02-22 DIAGNOSIS — I441 Atrioventricular block, second degree: Secondary | ICD-10-CM | POA: Diagnosis not present

## 2021-02-22 DIAGNOSIS — R42 Dizziness and giddiness: Secondary | ICD-10-CM | POA: Diagnosis not present

## 2021-02-22 NOTE — Patient Instructions (Signed)
Medication Instructions:  Your physician recommends that you continue on your current medications as directed. Please refer to the Current Medication list given to you today.  *If you need a refill on your cardiac medications before your next appointment, please call your pharmacy*   Lab Work: None If you have labs (blood work) drawn today and your tests are completely normal, you will receive your results only by: MyChart Message (if you have MyChart) OR A paper copy in the mail If you have any lab test that is abnormal or we need to change your treatment, we will call you to review the results.   Testing/Procedures: A zio monitor was ordered today. It will remain on for 14 days. You will then return monitor and event diary in provided box. It takes 1-2 weeks for report to be downloaded and returned to [place]. We will call you with the results. If monitor falls off or has orange flashing light, please call Zio for further instructions.     Follow-Up: At [facility], you and your health needs are our priority.  As part of our continuing mission to provide you with exceptional heart care, we have created designated Provider Care Teams.  These Care Teams include your primary Cardiologist (physician) and Advanced Practice Providers (APPs -  Physician Assistants and Nurse Practitioners) who all work together to provide you with the care you need, when you need it.  We recommend signing up for the patient portal called "MyChart".  Sign up information is provided on this After Visit Summary.  MyChart is used to connect with patients for Virtual Visits (Telemedicine).  Patients are able to view lab/test results, encounter notes, upcoming appointments, etc.  Non-urgent messages can be sent to your provider as well.   To learn more about what you can do with MyChart, go to [email or web address].    Your next appointment:   As needed

## 2021-03-11 ENCOUNTER — Telehealth: Payer: Self-pay

## 2021-03-11 NOTE — Telephone Encounter (Signed)
Spoke with patient regarding results and recommendation.  Patient verbalizes understanding and is agreeable to plan of care. Advised patient to call back with any issues or concerns.  

## 2021-03-11 NOTE — Telephone Encounter (Signed)
Left message on patients voicemail to please return our call.   

## 2021-03-11 NOTE — Telephone Encounter (Signed)
-----   Message from Baldo Daub, MD sent at 03/11/2021  7:38 AM EDT ----- Normal or stable result  Good result

## 2021-03-11 NOTE — Telephone Encounter (Signed)
Pt is returning call.  

## 2021-03-24 ENCOUNTER — Ambulatory Visit (INDEPENDENT_AMBULATORY_CARE_PROVIDER_SITE_OTHER): Payer: BC Managed Care – PPO | Admitting: Family Medicine

## 2021-03-24 ENCOUNTER — Ambulatory Visit: Payer: Self-pay

## 2021-03-24 ENCOUNTER — Encounter: Payer: Self-pay | Admitting: Family Medicine

## 2021-03-24 VITALS — BP 120/86 | Ht 72.0 in | Wt 219.0 lb

## 2021-03-24 DIAGNOSIS — M222X2 Patellofemoral disorders, left knee: Secondary | ICD-10-CM

## 2021-03-24 DIAGNOSIS — M722 Plantar fascial fibromatosis: Secondary | ICD-10-CM | POA: Insufficient documentation

## 2021-03-24 DIAGNOSIS — M25561 Pain in right knee: Secondary | ICD-10-CM

## 2021-03-24 MED ORDER — PREDNISONE 5 MG PO TABS: ORAL_TABLET | ORAL | 0 refills | Status: DC

## 2021-03-24 NOTE — Progress Notes (Signed)
  Terry Salinas - 20 y.o. male MRN [id number]  Date of birth: [birth year]  SUBJECTIVE:  Including CC & ROS.  No chief complaint on file.   Terry Salinas is a 20 y.o. male that is  presenting with acute right knee pain and left foot pain.  The foot pain is with the first few steps with getting out of bed in the morning.  This knee pain is been ongoing for 2 to 3 months.  He has been doing home exercises and felt he has plateaued in his rehab.  It is worse with jumping and running in the anterior aspect of the knee.  He does feel deep in the knee.   Review of Systems See HPI   HISTORY: Past Medical, Surgical, Social, and Family History Reviewed & Updated per EMR.   Pertinent Historical Findings include:  History reviewed. No pertinent past medical history.  Past Surgical History:  Procedure Laterality Date   NONE      Family History  Problem Relation Age of Onset   Diabetes Father    Hypertension Father    Healthy Sister     Social History   Socioeconomic History   Marital status: Single    Spouse name: Not on file   Number of children: Not on file   Years of education: Not on file   Highest education level: Not on file  Occupational History   Not on file  Tobacco Use   Smoking status: Never   Smokeless tobacco: Never  Vaping Use   Vaping Use: Never used  Substance and Sexual Activity   Alcohol use: No   Drug use: No   Sexual activity: Not on file  Other Topics Concern   Not on file  Social History Narrative   Not on file   Social Determinants of Health   Financial Resource Strain: Not on file  Food Insecurity: Not on file  Transportation Needs: Not on file  Physical Activity: Not on file  Stress: Not on file  Social Connections: Not on file  Intimate Partner Violence: Not on file     PHYSICAL EXAM:  VS: BP 120/86 (BP Location: Left Arm, Patient Position: Sitting)   Ht 6' (1.829 m)   Wt 219 lb (99.3 kg)   BMI 29.70 kg/m  Physical Exam Gen: NAD,  alert, cooperative with exam, well-appearing    Limited ultrasound: Right knee:  No effusion in suprapatellar pouch. Normal-appearing quadricep tendon. There is a hyperechoic calcification within the proximal portion of the patellar tendon with a hypoechoic change through the deep middle portion of the patellar tendon to suggest a tendinopathy. Normal appearing medial meniscus. Normal-appearing lateral meniscus  Summary: Patellar tendon changes.  Ultrasound and interpretation by Clare Gandy, MD    ASSESSMENT & PLAN:   Patellofemoral pain syndrome of left knee Symptoms seem more consistent with patellofemoral pain as his structure is reassuring.  Seems less likely for patellar tendon as a source with his pain being more deep in the knee. -Counseled on home exercise therapy and supportive care. -Prednisone. -Could consider physical therapy or further imaging.  Plantar fasciitis of left foot Having pain in the plantar aspect of the first few steps in the morning. - counseled on home exercise therapy and supportive care - prednisone

## 2021-03-24 NOTE — Patient Instructions (Signed)
Good to see you Please try icing  Please continue the exercises   Please send me a message in MyChart with any questions or updates.  Please see me back in 4 weeks.   --Dr. Jordan Likes

## 2021-03-24 NOTE — Assessment & Plan Note (Signed)
Symptoms seem more consistent with patellofemoral pain as his structure is reassuring.  Seems less likely for patellar tendon as a source with his pain being more deep in the knee. -Counseled on home exercise therapy and supportive care. -Prednisone. -Could consider physical therapy or further imaging.

## 2021-03-24 NOTE — Assessment & Plan Note (Signed)
Having pain in the plantar aspect of the first few steps in the morning. - counseled on home exercise therapy and supportive care - prednisone

## 2021-04-19 ENCOUNTER — Emergency Department (HOSPITAL_BASED_OUTPATIENT_CLINIC_OR_DEPARTMENT_OTHER): Payer: BC Managed Care – PPO

## 2021-04-19 ENCOUNTER — Emergency Department (HOSPITAL_BASED_OUTPATIENT_CLINIC_OR_DEPARTMENT_OTHER)
Admission: EM | Admit: 2021-04-19 | Discharge: 2021-04-19 | Disposition: A | Discharge status: Home / Self Care | Payer: BC Managed Care – PPO | Attending: Emergency Medicine | Admitting: Emergency Medicine

## 2021-04-19 ENCOUNTER — Other Ambulatory Visit: Payer: Self-pay

## 2021-04-19 ENCOUNTER — Encounter (HOSPITAL_BASED_OUTPATIENT_CLINIC_OR_DEPARTMENT_OTHER): Payer: Self-pay

## 2021-04-19 DIAGNOSIS — R001 Bradycardia, unspecified: Secondary | ICD-10-CM | POA: Insufficient documentation

## 2021-04-19 DIAGNOSIS — R0789 Other chest pain: Secondary | ICD-10-CM | POA: Diagnosis present

## 2021-04-19 LAB — CBC
HCT: 42.7 % (ref 39.0–52.0)
Hemoglobin: 14.4 g/dL (ref 13.0–17.0)
MCH: 29 pg (ref 26.0–34.0)
MCHC: 33.7 g/dL (ref 30.0–36.0)
MCV: 86.1 fL (ref 80.0–100.0)
Platelets: 208 10*3/uL (ref 150–400)
RBC: 4.96 MIL/uL (ref 4.22–5.81)
RDW: 12.2 % (ref 11.5–15.5)
WBC: 5.7 10*3/uL (ref 4.0–10.5)
nRBC: 0 % (ref 0.0–0.2)

## 2021-04-19 LAB — BASIC METABOLIC PANEL
Anion gap: 6 (ref 5–15)
BUN: 19 mg/dL (ref 6–20)
CO2: 27 mmol/L (ref 22–32)
Calcium: 9.1 mg/dL (ref 8.9–10.3)
Chloride: 101 mmol/L (ref 98–111)
Creatinine, Ser: 1.27 mg/dL — ABNORMAL HIGH (ref 0.61–1.24)
GFR, Estimated: >60 mL/min (ref >60)
Glucose, Bld: 90 mg/dL (ref 70–99)
Potassium: 4.2 mmol/L (ref 3.5–5.1)
Sodium: 134 mmol/L — ABNORMAL LOW (ref 135–145)

## 2021-04-19 LAB — TROPONIN I (HIGH SENSITIVITY)
Troponin I (High Sensitivity): 3 ng/L (ref <18)
Troponin I (High Sensitivity): 3 ng/L (ref <18)

## 2021-04-19 NOTE — ED Provider Notes (Signed)
[facility] EMERGENCY DEPARTMENT Provider Note   CSN: [id number] Arrival date & time: 04/19/21  1037     History Chief Complaint  Patient presents with   Chest Pain    Terry Salinas is a 20 y.o. male who presents with concern for left lateral chest pain that started around 6 o'clock this morning, it was sharp in nature.  Improved transiently with ibuprofen but then recurred and has been more severe, sharp stabbing type sensation.  Not worse with exertion.  No associated shortness of breath, palpitations, nausea or vomiting.  Pain does not radiate.  No recent viral symptoms.  Patient does state he has history of second-degree heart block and follow-up with cardiology with whom he wore a Holter monitor and was told he is second-degree heart block.  No medications prescribed at that time.  Additionally, transiently experiencing episodes of lightheadedness.   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  I have personally reviewed this patient's medical records.  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 Norman Herrlich, who favors these EKG changes as a marker of high athleticism and CV fitness. 1 week holter monitor was normal per cardiology documentation in 03/11/21.   HPI  HPI: A 20 year old patient presents for evaluation of chest pain. Initial onset of pain was more than 6 hours ago. The patient's chest pain is sharp and is not worse with exertion. The patient's chest pain is middle- or left-sided, is not well-localized, is not described as heaviness/pressure/tightness and does not radiate to the arms/jaw/neck. The patient does not complain of nausea and denies diaphoresis. The patient has a family history of coronary artery disease in a first-degree relative with onset less than age 42. The patient has no  history of stroke, has no history of peripheral artery disease, has not smoked in the past 90 days, denies any history of treated diabetes, is not hypertensive, has no history of hypercholesterolemia and does not have an elevated BMI (>=30).   History reviewed. No pertinent past medical history.  Patient Active Problem List   Diagnosis Date Noted   Patellofemoral pain syndrome of left knee 03/24/2021   Plantar fasciitis of left foot 03/24/2021   Sinus bradycardia 02/21/2021   Patellar tendinitis of right knee 11/08/2020   Nondisplaced fracture of fifth metatarsal bone, left foot, subsequent encounter for fracture with routine healing 09/06/2020   Right knee injury, subsequent encounter 01/24/2017   Fracture of phalanx of left middle finger 08/13/2015    Past Surgical History:  Procedure Laterality Date   NONE         Family History  Problem Relation Age of Onset   Diabetes Father    Hypertension Father    Healthy Sister     Social History   Tobacco Use   Smoking status: Never   Smokeless tobacco: Never  Vaping Use   Vaping Use: Never used  Substance Use Topics   Alcohol use: No   Drug use: No    Home Medications Prior to Admission medications   Medication Sig Start Date End Date Taking? Authorizing Provider  predniSONE (DELTASONE) 5 MG tablet Take 6 pills for first day, 5 pills second day, 4 pills third day, 3 pills fourth day, 2 pills the fifth day, and 1 pill sixth day. 03/24/21   Myra Rude,  MD  Vitamin D, Ergocalciferol, (DRISDOL) 1.25 MG (50000 UNIT) CAPS capsule Take 50,000 Units by mouth once a week. 01/18/21   [provider]    Allergies    Fish allergy  Review of Systems   Review of Systems  Constitutional: Negative.   HENT: Negative.    Respiratory: Negative.    Cardiovascular:  Positive for chest pain. Negative for palpitations and leg swelling.  Gastrointestinal: Negative.   Genitourinary: Negative.   Musculoskeletal: Negative.    Neurological:  Positive for light-headedness. Negative for dizziness, tremors, syncope, facial asymmetry, weakness and headaches.   Physical Exam Updated Vital Signs BP 129/82 (BP Location: Right Arm)   Pulse (!) 49   Temp 98.2 F (36.8 C) (Oral)   Resp 18   Ht 6' (1.829 m)   Wt 103.4 kg   SpO2 100%   BMI 30.92 kg/m   Physical Exam Vitals and nursing note reviewed.  Constitutional:      Appearance: He is not ill-appearing or toxic-appearing.  HENT:     Head: Normocephalic and atraumatic.     Nose: Nose normal.     Mouth/Throat:     Mouth: Mucous membranes are moist.     Pharynx: Oropharynx is clear. Uvula midline. No oropharyngeal exudate or posterior oropharyngeal erythema.     Tonsils: No tonsillar exudate.  Eyes:     General: Lids are normal. Vision grossly intact.        Right eye: No discharge.        Left eye: No discharge.     Conjunctiva/sclera: Conjunctivae normal.  Cardiovascular:     Rate and Rhythm: Regular rhythm. Bradycardia present.     Pulses: Normal pulses.     Heart sounds: Normal heart sounds. No murmur heard.    Comments: HR 49 at time of my exam. Pulmonary:     Effort: Pulmonary effort is normal. No respiratory distress.     Breath sounds: Normal breath sounds. No wheezing or rales.  Abdominal:     General: Bowel sounds are normal. There is no distension.     Tenderness: There is no abdominal tenderness.  Musculoskeletal:        General: No deformity.     Cervical back: Normal range of motion and neck supple.  Skin:    General: Skin is warm and dry.     Capillary Refill: Capillary refill takes less than 2 seconds.  Neurological:     Mental Status: He is alert. Mental status is at baseline.     GCS: GCS eye subscore is 4. GCS verbal subscore is 5. GCS motor subscore is 6.     Gait: Gait is intact. Gait normal.  Psychiatric:        Mood and Affect: Mood normal.    ED Results / Procedures / Treatments   Labs (all labs ordered are listed,  but only abnormal results are displayed) Labs Reviewed  BASIC METABOLIC PANEL - Abnormal; Notable for the following components:      Result Value   Sodium 134 (*)    Creatinine, Ser 1.27 (*)    All other components within normal limits  CBC  TROPONIN I (HIGH SENSITIVITY)  TROPONIN I (HIGH SENSITIVITY)    EKG EKG Interpretation  Date/Time:  Tuesday April 19 2021 10:49:03 EST Ventricular Rate:  48 PR Interval:  183 QRS Duration: 96 QT Interval:  415 QTC Calculation: 371 R Axis:   69 Text Interpretation: Sinus bradycardia RSR' in V1 or V2, probably normal  variant No significant change since prior 8/22 Confirmed by Meridee Score [phone]) on 04/19/2021 10:51:35 AM  Radiology DG Chest 2 View  Result Date: 04/19/2021 CLINICAL DATA:  Provided history: Chest pain. Additional history provided: Patient reports left chest pain/left rib pain, worse when lying down, lightheadedness this morning, history of second degree heart block. EXAM: CHEST - 2 VIEW COMPARISON:  Prior chest radiographs 12/27/2020 and earlier. FINDINGS: Heart size within normal limits. No appreciable airspace consolidation. No evidence of pleural effusion or pneumothorax. No acute bony abnormality identified. IMPRESSION: No evidence of active cardiopulmonary disease. Electronically Signed   By: Jackey Loge D.O.   On: 04/19/2021 11:22    Procedures Procedures   Medications Ordered in ED Medications - No data to display  ED Course  I have reviewed the triage vital signs and the nursing notes.  Pertinent labs & imaging results that were available during my care of the patient were reviewed by me and considered in my medical decision making (see chart for details).    MDM Rules/Calculators/A&P HEAR Score: 2                       20 year old male with left lateral chest pain and transient lightheadedness that started at 6 am today and is ongoing.   Differential diagnosis includes but is limited to ACS, PE, pleural  effusion, pneumothorax, pneumonia, dysrhythmia, pleuritis, muscular injury.  Bradycardic on intake, HR of 48 at patient baseline. VS otherwise normal. Cardiopulmonary exam is normal, abdominal exam is benign. No BLE edema. Ambulatory without difficulty.   EKG with sinus bradycardia with RSR prime, likely normal variant with no change since August of this year.  Chest x-ray negative for acute cardiopulmonary disease.  CBC unremarkable, BMP unremarkable, troponin negative, 3x2.  No further work-up warranted at this time.  Heart score is reassuring, 2.  PERC negative, Wells criteria negative.  Doubt acute cardiopulmonary etiology for this patient's symptoms.  Favor muscular strain; will discharge with instructions to take NSAID medication and follow-up with his PCP/cardiologist.  Angle voiced understanding of his medical evaluation and treatment plan.  Each of his questions answered to his expressed satisfaction.  Return precautions given.  Patient is well-appearing, stable, and appropriate for discharge at this time.  This chart was dictated using voice recognition software, Dragon. Despite the best efforts of this provider to proofread and correct errors, errors may still occur which can change documentation meaning.   Final Clinical Impression(s) / ED Diagnoses Final diagnoses:  Atypical chest pain    Rx / DC Orders ED Discharge Orders     None        Sherrilee Gilles 04/19/21 1445    Terrilee Files, MD 04/19/21 1805

## 2021-04-19 NOTE — ED Triage Notes (Signed)
Pt c/o left sided chest pain to left ribs, worse when lying down. States hx of 2nd degree hear block. Felt lightheaded this morning.

## 2021-04-19 NOTE — Discharge Instructions (Signed)
You are seen in the ER today for your chest pain.  Your physical exam, vital signs, blood work, EKG, and chest x-ray are very reassuring.  While the exact cause of your symptoms remains unclear there is not appear to be any dangerous source at this time.  Your heart and lungs do not show any sign of stress on your work-up today.  Please follow-up with your primary care doctor in 1 week for reevaluation.  Take ibuprofen as needed for your pain every 6 hours; you may alternate this with Tylenol if necessary.  Return to ER if you develop any difficulty breathing, nausea or vomiting that does not stop, worsening chest pain, or any other new severe symptoms.

## 2021-04-21 ENCOUNTER — Ambulatory Visit: Payer: BC Managed Care – PPO | Admitting: Family Medicine

## 2021-04-21 ENCOUNTER — Encounter: Payer: Self-pay | Admitting: Family Medicine

## 2021-04-21 DIAGNOSIS — M7651 Patellar tendinitis, right knee: Secondary | ICD-10-CM

## 2021-04-21 NOTE — Progress Notes (Signed)
  Terry Salinas - 20 y.o. male MRN [id number]  Date of birth: 23-Jun-2000  SUBJECTIVE:  Including CC & ROS.  No chief complaint on file.   Terry Salinas is a 20 y.o. male that is following up for his right knee pain. Has been continuing with the home exercises.  He has limited his playing time basketball.   Review of Systems See HPI   HISTORY: Past Medical, Surgical, Social, and Family History Reviewed & Updated per EMR.   Pertinent Historical Findings include:  History reviewed. No pertinent past medical history.  Past Surgical History:  Procedure Laterality Date   NONE      Family History  Problem Relation Age of Onset   Diabetes Father    Hypertension Father    Healthy Sister     Social History   Socioeconomic History   Marital status: Single    Spouse name: Not on file   Number of children: Not on file   Years of education: Not on file   Highest education level: Not on file  Occupational History   Not on file  Tobacco Use   Smoking status: Never   Smokeless tobacco: Never  Vaping Use   Vaping Use: Never used  Substance and Sexual Activity   Alcohol use: No   Drug use: No   Sexual activity: Not on file  Other Topics Concern   Not on file  Social History Narrative   Not on file   Social Determinants of Health   Financial Resource Strain: Not on file  Food Insecurity: Not on file  Transportation Needs: Not on file  Physical Activity: Not on file  Stress: Not on file  Social Connections: Not on file  Intimate Partner Violence: Not on file     PHYSICAL EXAM:  VS: BP 140/90 (BP Location: Left Arm, Patient Position: Sitting)   Ht 6' (1.829 m)   Wt 228 lb (103.4 kg)   BMI 30.92 kg/m  Physical Exam Gen: NAD, alert, cooperative with exam, well-appearing      ASSESSMENT & PLAN:   Patellar tendinitis of right knee Pain is ongoing.  It is intermittent in nature.  He continues work on home exercises. -Counseled on home exercise therapy and  supportive care. -Could consider further imaging.

## 2021-04-21 NOTE — Assessment & Plan Note (Signed)
Pain is ongoing.  It is intermittent in nature.  He continues work on home exercises. -Counseled on home exercise therapy and supportive care. -Could consider further imaging.

## 2021-11-24 ENCOUNTER — Emergency Department (HOSPITAL_BASED_OUTPATIENT_CLINIC_OR_DEPARTMENT_OTHER)
Admission: EM | Admit: 2021-11-24 | Discharge: 2021-11-24 | Disposition: A | Discharge status: Home / Self Care | Payer: BC Managed Care – PPO | Attending: Emergency Medicine | Admitting: Emergency Medicine

## 2021-11-24 ENCOUNTER — Encounter (HOSPITAL_BASED_OUTPATIENT_CLINIC_OR_DEPARTMENT_OTHER): Payer: Self-pay | Admitting: Emergency Medicine

## 2021-11-24 DIAGNOSIS — X503XXA Overexertion from repetitive movements, initial encounter: Secondary | ICD-10-CM | POA: Insufficient documentation

## 2021-11-24 DIAGNOSIS — S199XXA Unspecified injury of neck, initial encounter: Secondary | ICD-10-CM | POA: Diagnosis present

## 2021-11-24 DIAGNOSIS — M62838 Other muscle spasm: Secondary | ICD-10-CM | POA: Insufficient documentation

## 2021-11-24 DIAGNOSIS — S161XXA Strain of muscle, fascia and tendon at neck level, initial encounter: Secondary | ICD-10-CM | POA: Diagnosis not present

## 2021-11-24 MED ORDER — IBUPROFEN 400 MG PO TABS
400.0000 mg | ORAL_TABLET | Freq: Once | ORAL | Status: AC
  Administered 2021-11-24: 400 mg via ORAL
  Filled 2021-11-24: qty 1

## 2021-11-24 MED ORDER — CYCLOBENZAPRINE HCL 10 MG PO TABS: 10.0000 mg | ORAL_TABLET | Freq: Two times a day (BID) | ORAL | 0 refills | Status: AC | PRN

## 2021-11-24 NOTE — ED Notes (Signed)
ED Provider at bedside. 

## 2021-11-24 NOTE — ED Triage Notes (Signed)
Pt c/o left sided neck pain since yesterday. States it feels like something is stabbing him. Pain radiates forward to anterior neck and up into face. Movement worsens pain. Denies injury. Mild headache.

## 2021-11-24 NOTE — ED Provider Notes (Signed)
[facility] EMERGENCY DEPARTMENT Provider Note   CSN: [id number] Arrival date & time: 11/24/21  0019     History  Chief Complaint  Patient presents with   Neck Pain    Terry Salinas is a 21 y.o. male.  The history is provided by the patient.  Neck Pain Pain location:  L side Quality:  Stiffness Pain severity:  Moderate Timing:  Constant Progression:  Worsening Chronicity:  New Context: not fall   Relieved by:  Position Associated symptoms: no chest pain, no fever, no visual change and no weakness   Patient reports he woke up with left neck pain.  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  He reports he had pain in the left neck that is worse with movement.  Over time the pain is radiating to his head & triggered a headache.  He reports brief numbness throughout his head that is resolved.  No visual changes.  No arm or leg weakness.  No chest pain.  No previous neck surgery No trauma, but he does lift heavy equipment and weights above his head   Home Medications Prior to Admission medications   Medication Sig Start Date End Date Taking? Authorizing Provider  cyclobenzaprine (FLEXERIL) 10 MG tablet Take 1 tablet (10 mg total) by mouth 2 (two) times daily as needed for muscle spasms. 11/24/21  Yes Zadie Rhine, MD  Vitamin D, Ergocalciferol, (DRISDOL) 1.25 MG (50000 UNIT) CAPS capsule Take 50,000 Units by mouth once a week. 01/18/21   [provider]      Allergies    Fish allergy    Review of Systems   Review of Systems  Constitutional:  Negative for fever.  Eyes:  Negative for visual disturbance.  Cardiovascular:  Negative for chest pain.  Musculoskeletal:  Positive for neck pain.  Neurological:  Negative for weakness.    Physical Exam Updated Vital Signs BP 127/79 (BP Location: Right Arm)   Pulse (!) 52   Temp 98.6 F (37 C) (Oral)   Resp 20   Ht 1.829 m (6')   Wt 102.1 kg   SpO2 99%   BMI 30.52 kg/m   Physical Exam CONSTITUTIONAL: Well developed/well nourished HEAD: Normocephalic/atraumatic EYES: EOMI/PERRL, no nystagmus, no ptosis ENMT: Mucous membranes moist NECK: supple no meningeal signs, no erythema, no abscess, no induration to his neck CV: S1/S2 noted, no murmurs/rubs/gallops noted LUNGS: Lungs are clear to auscultation bilaterally, no apparent distress ABDOMEN: soft, nontender, no rebound or guarding GU:no cva tenderness NEURO:Awake/alert, face symmetric, no arm or leg drift is noted Equal 5/5 strength with shoulder abduction, elbow flex/extension, wrist flex/extension in upper extremities and equal hand grips bilaterally Equal 5/5 strength with hip flexion,knee flex/extension, foot dorsi/plantar flexion Cranial nerves 3/4/5/6/11/20/08/11/12 tested and intact Sensation to light touch intact in all extremities Equal power (5/5) with hand grip, wrist flex/extension, elbow flex/extension, and equal power with shoulder abduction/adduction.  No focal sensory deficit to light touch is noted in either UE.   Equal (2+) biceps/brachioradialis reflex in bilateral UE EXTREMITIES: pulses normal, full ROM SKIN: warm, color normal PSYCH: no abnormalities of mood noted  ED Results / Procedures / Treatments   Labs (all labs ordered are listed, but only abnormal results are displayed) Labs Reviewed - No data to display  EKG EKG Interpretation  Date/Time:  Thursday November 24 2021 00:31:46 EDT Ventricular Rate:  50 PR Interval:  180 QRS Duration: 97 QT Interval:  422 QTC Calculation: 385  R Axis:   69 Text Interpretation: Sinus rhythm ST elev, probable normal early repol pattern Confirmed by Zadie Rhine ([id number]) on 11/24/2021 12:38:47 AM  Radiology No results found.  Procedures Procedures    Medications Ordered in ED Medications  ibuprofen (ADVIL) tablet 400 mg (400 mg Oral Given 11/24/21 0123)    ED Course/ Medical Decision Making/ A&P                           Medical  Decision Making Risk Prescription drug management.   Patient reports he woke up with left neck pain.  It is worsened with movement. He has no focal neurodeficits. Differential includes CVA, carotid dissection, muscle strain, cervical myelopathy, cervical radiculopathy Patient is well-appearing, no acute distress, no neurodeficits.  Will treat as muscle strain He is safe for discharge home.  We discussed return precaution       Final Clinical Impression(s) / ED Diagnoses Final diagnoses:  Strain of neck muscle, initial encounter  Muscle spasms of neck    Rx / DC Orders ED Discharge Orders          Ordered    cyclobenzaprine (FLEXERIL) 10 MG tablet  2 times daily PRN        11/24/21 0117              Zadie Rhine, MD 11/24/21 0127

## 2021-11-24 NOTE — ED Notes (Signed)
Rx x 1 given  Written and verbal inst to pt  Verbalized an understanding  To home  

## 2021-11-24 NOTE — Discharge Instructions (Addendum)
You have neck pain, possibly from a cervical strain and/or pinched nerve.  ° °SEEK IMMEDIATE MEDICAL ATTENTION IF: °You develop difficulties swallowing or breathing.  °You have new or worse numbness, weakness, tingling, or movement problems in your arms or legs.  °You develop increasing pain which is uncontrolled with medications.  °You have change in bowel or bladder function, or other concerns. ° ° ° °

## 2022-02-22 IMAGING — DX DG FOOT COMPLETE 3+V*L*
2 series · 2 of 2 positions shown · non-contrast
Comparison: 09/01/2020

CLINICAL DATA: Left foot fracture 1 month ago.

EXAM:
LEFT FOOT - COMPLETE 3+ VIEW

[foot ap]
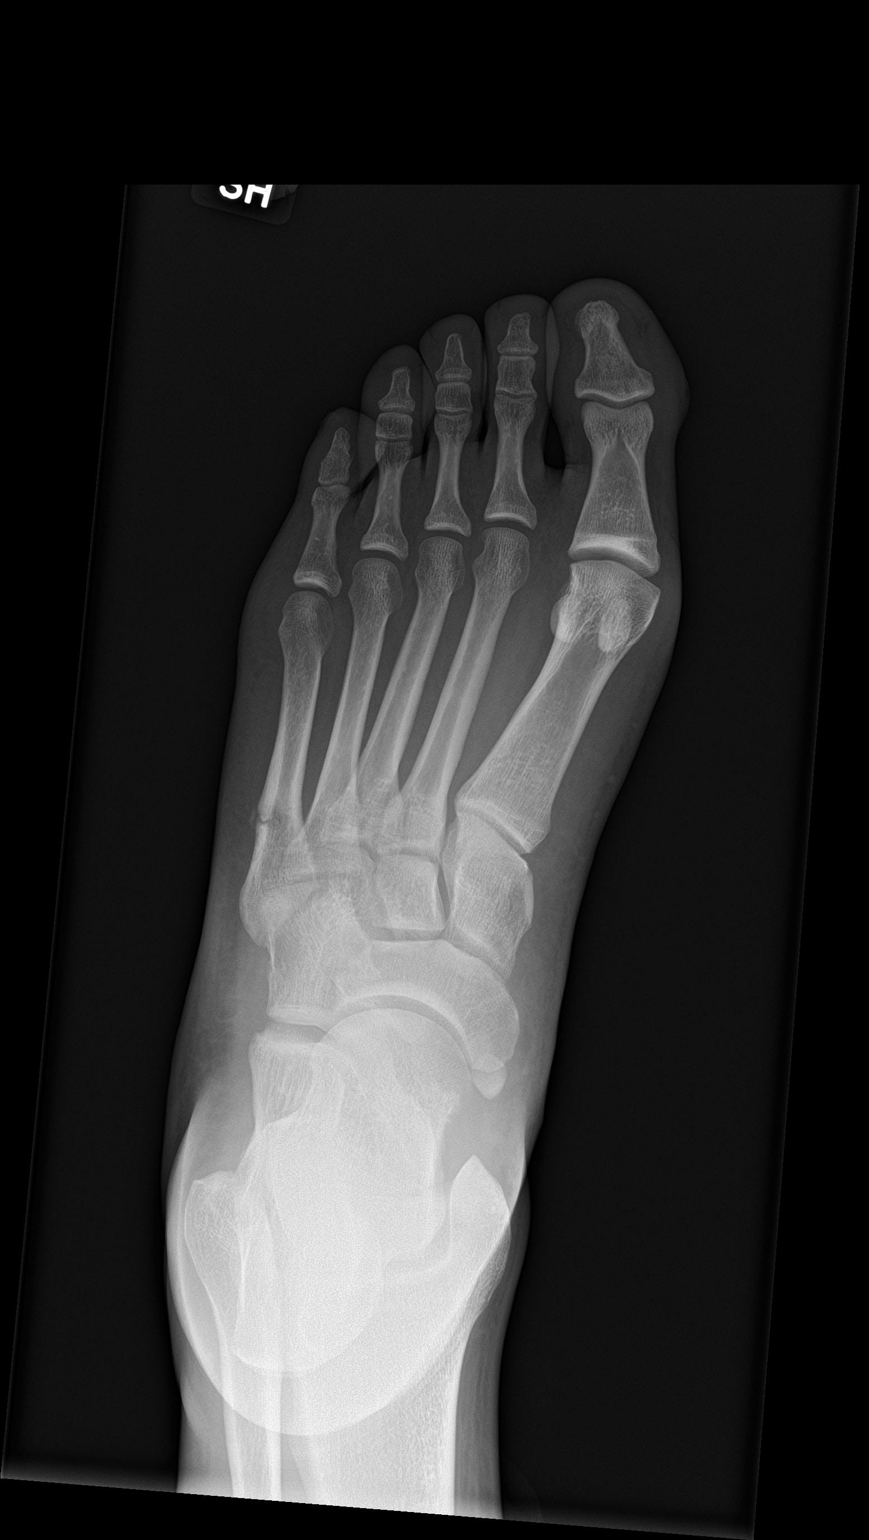

[foot lat]
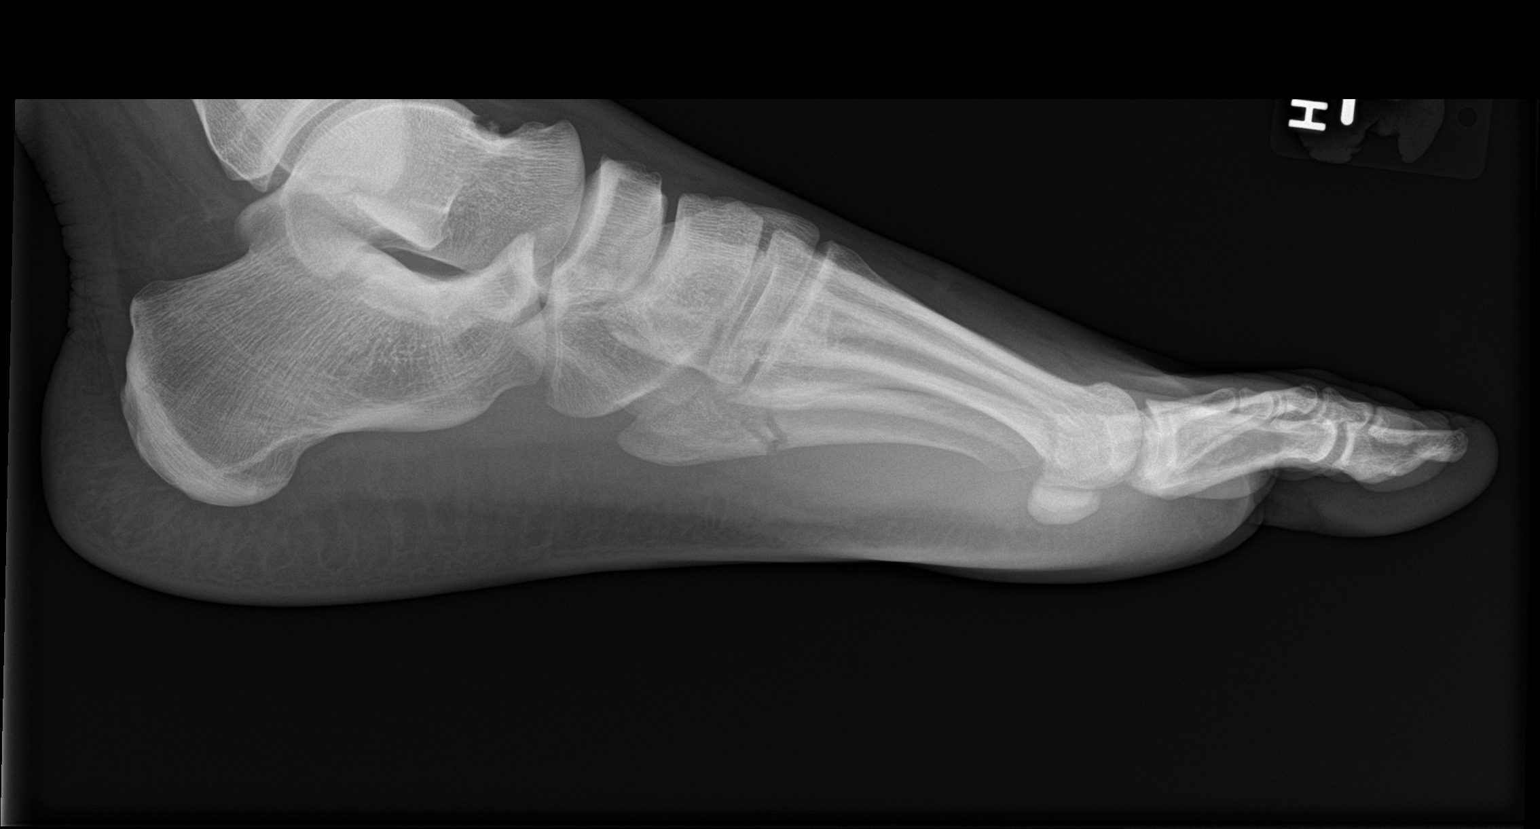

[2 of 2 positions shown; findings below may reference images not displayed]

FINDINGS: Evidence of patient's known subacute fracture over the base of the
fifth metatarsal with residual lucency at the fracture site with
possible subtle interval widening of the fracture line. Minimal
interval bridging callus formation laterally. Remainder the exam is
unchanged.
IMPRESSION: Known subacute fracture over the base of the fifth metatarsal with
minimal interval bridging callus formation and slight interval
widening of the fracture line.

## 2022-08-28 ENCOUNTER — Encounter: Payer: Self-pay | Admitting: *Deleted

## 2022-09-21 IMAGING — DX DG CHEST 2V
2 series · 2 of 2 positions shown · non-contrast
Comparison: Prior chest radiographs 12/27/2020 and earlier.

CLINICAL DATA: Provided history: Chest pain. Additional history
provided: Patient reports left chest pain/left rib pain, worse when
lying down, lightheadedness this morning, history of second degree
heart block.

EXAM:
CHEST - 2 VIEW

[chest pa]
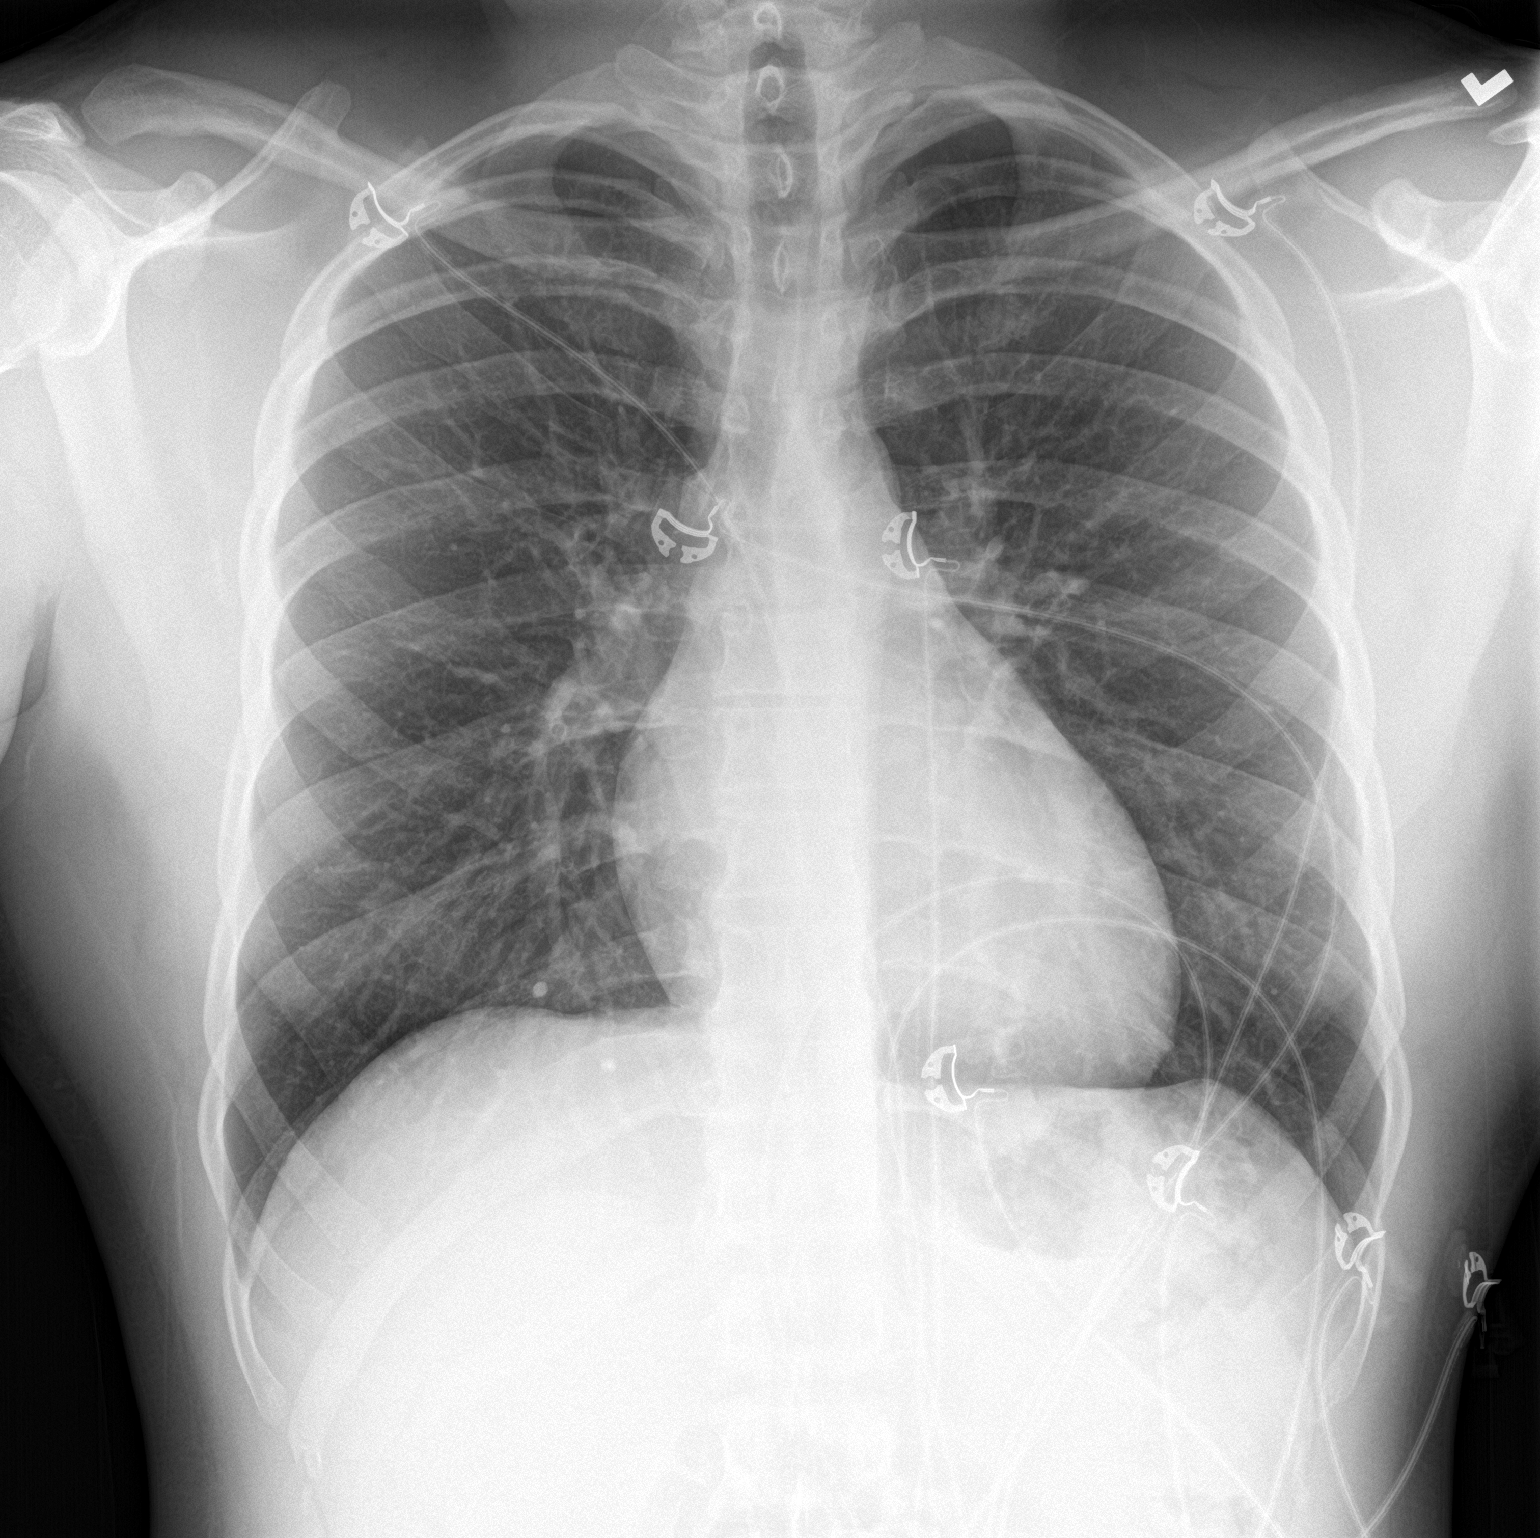

[chest lat]
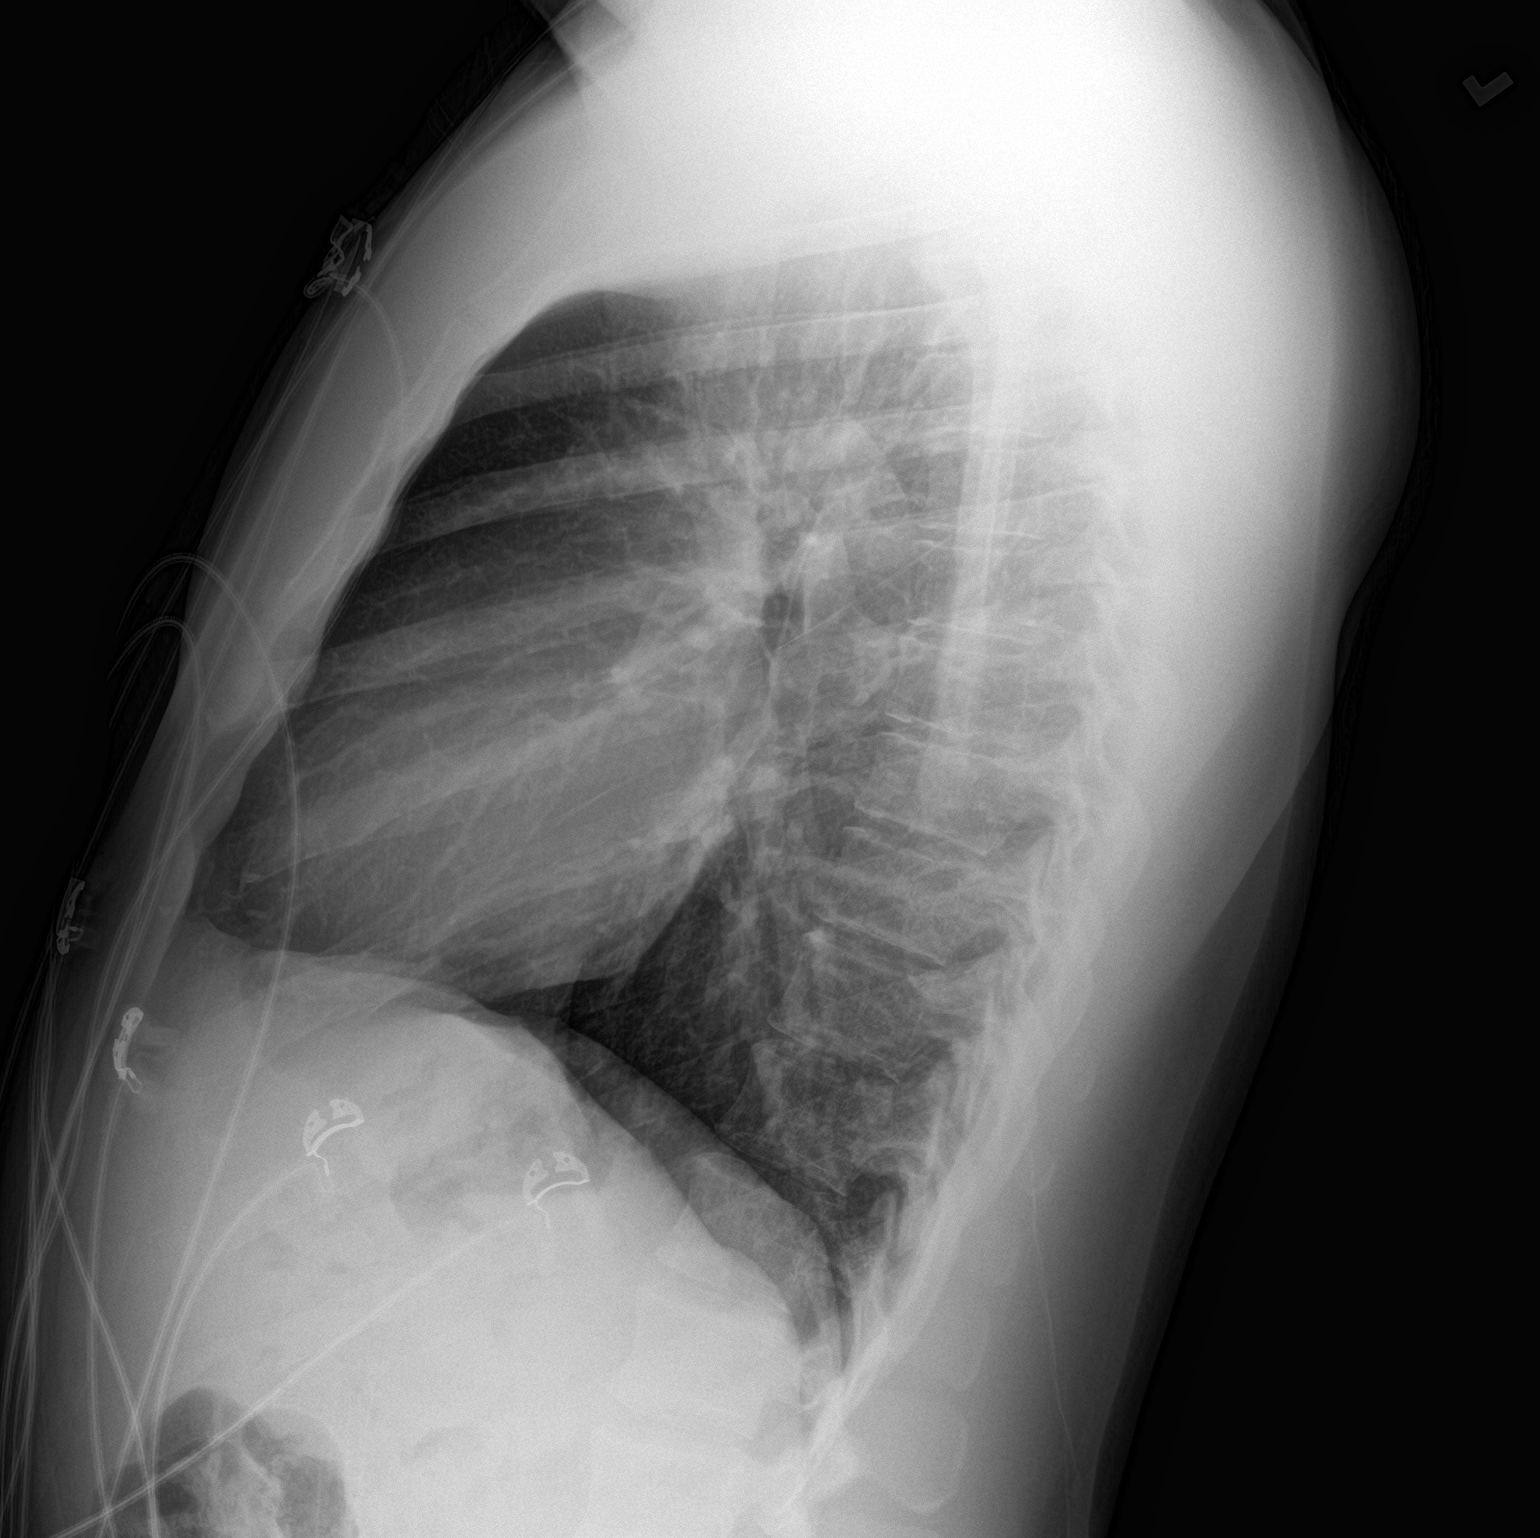

[2 of 2 positions shown; findings below may reference images not displayed]

FINDINGS: Heart size within normal limits. No appreciable airspace
consolidation. No evidence of pleural effusion or pneumothorax. No
acute bony abnormality identified.
IMPRESSION: No evidence of active cardiopulmonary disease.
